# Patient Record
Sex: Male | Born: 1965 | Race: White | Hispanic: No | Marital: Single | Smoking: Former smoker
Health system: Southern US, Community
[De-identification: ages and names within clinical notes are randomized; demographics above are authoritative.]

## PROBLEM LIST (undated history)

## (undated) DIAGNOSIS — E66811 Obesity, class 1: Secondary | ICD-10-CM

## (undated) DIAGNOSIS — H43393 Other vitreous opacities, bilateral: Secondary | ICD-10-CM

## (undated) DIAGNOSIS — I517 Cardiomegaly: Secondary | ICD-10-CM

## (undated) DIAGNOSIS — F419 Anxiety disorder, unspecified: Secondary | ICD-10-CM

## (undated) DIAGNOSIS — U071 COVID-19: Secondary | ICD-10-CM

## (undated) DIAGNOSIS — B078 Other viral warts: Secondary | ICD-10-CM

## (undated) DIAGNOSIS — L719 Rosacea, unspecified: Secondary | ICD-10-CM

## (undated) DIAGNOSIS — J449 Chronic obstructive pulmonary disease, unspecified: Secondary | ICD-10-CM

## (undated) DIAGNOSIS — H5203 Hypermetropia, bilateral: Secondary | ICD-10-CM

## (undated) DIAGNOSIS — I493 Ventricular premature depolarization: Secondary | ICD-10-CM

## (undated) DIAGNOSIS — H16203 Unspecified keratoconjunctivitis, bilateral: Secondary | ICD-10-CM

## (undated) DIAGNOSIS — Z86711 Personal history of pulmonary embolism: Secondary | ICD-10-CM

## (undated) DIAGNOSIS — Z7901 Long term (current) use of anticoagulants: Secondary | ICD-10-CM

## (undated) DIAGNOSIS — M542 Cervicalgia: Secondary | ICD-10-CM

## (undated) DIAGNOSIS — N529 Male erectile dysfunction, unspecified: Secondary | ICD-10-CM

## (undated) DIAGNOSIS — R5382 Chronic fatigue, unspecified: Secondary | ICD-10-CM

## (undated) DIAGNOSIS — E785 Hyperlipidemia, unspecified: Secondary | ICD-10-CM

## (undated) DIAGNOSIS — R002 Palpitations: Secondary | ICD-10-CM

## (undated) DIAGNOSIS — R072 Precordial pain: Secondary | ICD-10-CM

## (undated) DIAGNOSIS — E6609 Other obesity due to excess calories: Secondary | ICD-10-CM

## (undated) DIAGNOSIS — G4762 Sleep related leg cramps: Secondary | ICD-10-CM

## (undated) HISTORY — DX: Other viral warts: B07.8

## (undated) HISTORY — PX: ESOPHAGOGASTRODUODENOSCOPY: SHX1529

## (undated) HISTORY — DX: Ventricular premature depolarization: I49.3

## (undated) HISTORY — DX: Cervicalgia: M54.2

## (undated) HISTORY — DX: Hypermetropia, bilateral: H52.03

## (undated) HISTORY — DX: Anxiety disorder, unspecified: F41.9

## (undated) HISTORY — DX: Chronic obstructive pulmonary disease, unspecified: J44.9

## (undated) HISTORY — DX: Palpitations: R00.2

## (undated) HISTORY — DX: Obesity, class 1: E66.811

## (undated) HISTORY — DX: Other vitreous opacities, bilateral: H43.393

## (undated) HISTORY — DX: Cardiomegaly: I51.7

## (undated) HISTORY — DX: Long term (current) use of anticoagulants: Z79.01

## (undated) HISTORY — DX: Other obesity due to excess calories: E66.09

## (undated) HISTORY — DX: Precordial pain: R07.2

## (undated) HISTORY — DX: Hyperlipidemia, unspecified: E78.5

## (undated) HISTORY — DX: Rosacea, unspecified: L71.9

## (undated) HISTORY — DX: Chronic fatigue, unspecified: R53.82

## (undated) HISTORY — PX: TONSILLECTOMY: SUR1361

## (undated) HISTORY — DX: Male erectile dysfunction, unspecified: N52.9

## (undated) HISTORY — DX: Unspecified keratoconjunctivitis, bilateral: H16.203

## (undated) HISTORY — DX: Personal history of pulmonary embolism: Z86.711

## (undated) HISTORY — PX: COLONOSCOPY: SHX174

## (undated) HISTORY — DX: Sleep related leg cramps: G47.62

---

## 2017-07-21 DIAGNOSIS — E782 Mixed hyperlipidemia: Secondary | ICD-10-CM | POA: Insufficient documentation

## 2017-07-21 HISTORY — DX: Mixed hyperlipidemia: E78.2

## 2017-08-05 DIAGNOSIS — E6609 Other obesity due to excess calories: Secondary | ICD-10-CM | POA: Insufficient documentation

## 2017-08-15 DIAGNOSIS — R079 Chest pain, unspecified: Secondary | ICD-10-CM

## 2017-08-15 HISTORY — DX: Chest pain, unspecified: R07.9

## 2017-08-16 DIAGNOSIS — R002 Palpitations: Secondary | ICD-10-CM | POA: Insufficient documentation

## 2017-11-19 DIAGNOSIS — I2699 Other pulmonary embolism without acute cor pulmonale: Secondary | ICD-10-CM

## 2017-11-19 HISTORY — DX: Other pulmonary embolism without acute cor pulmonale: I26.99

## 2018-02-24 DIAGNOSIS — I493 Ventricular premature depolarization: Secondary | ICD-10-CM | POA: Insufficient documentation

## 2018-02-24 DIAGNOSIS — I517 Cardiomegaly: Secondary | ICD-10-CM | POA: Insufficient documentation

## 2018-04-13 DIAGNOSIS — Z7901 Long term (current) use of anticoagulants: Secondary | ICD-10-CM | POA: Insufficient documentation

## 2018-04-13 HISTORY — DX: Long term (current) use of anticoagulants: Z79.01

## 2018-04-29 DIAGNOSIS — F418 Other specified anxiety disorders: Secondary | ICD-10-CM | POA: Insufficient documentation

## 2018-04-29 DIAGNOSIS — J449 Chronic obstructive pulmonary disease, unspecified: Secondary | ICD-10-CM | POA: Insufficient documentation

## 2018-04-29 HISTORY — DX: Chronic obstructive pulmonary disease, unspecified: J44.9

## 2018-12-10 HISTORY — PX: CARDIAC CATHETERIZATION: SHX172

## 2019-04-12 ENCOUNTER — Telehealth: Payer: Self-pay | Admitting: Cardiology

## 2019-04-12 NOTE — Telephone Encounter (Signed)
Virtual Visit Pre-Appointment Phone Call  "(Name), I am calling you today to discuss your upcoming appointment. We are currently trying to limit exposure to the virus that causes COVID-19 by seeing patients at home rather than in the office."  1. "What is the BEST phone number to call the day of the visit?" - include this in appointment notes  2. Do you have or have access to (through a family member/friend) a smartphone with video capability that we can use for your visit?" a. If yes - list this number in appt notes as cell (if different from BEST phone #) and list the appointment type as a VIDEO visit in appointment notes b. If no - list the appointment type as a PHONE visit in appointment notes  3. Confirm consent - "In the setting of the current Covid19 crisis, you are scheduled for a (phone or video) visit with your provider on (date) at (time).  Just as we do with many in-office visits, in order for you to participate in this visit, we must obtain consent.  If you'd like, I can send this to your mychart (if signed up) or email for you to review.  Otherwise, I can obtain your verbal consent now.  All virtual visits are billed to your insurance company just like a normal visit would be.  By agreeing to a virtual visit, we'd like you to understand that the technology does not allow for your provider to perform an examination, and thus may limit your provider's ability to fully assess your condition. If your provider identifies any concerns that need to be evaluated in person, we will make arrangements to do so.  Finally, though the technology is pretty good, we cannot assure that it will always work on either your or our end, and in the setting of a video visit, we may have to convert it to a phone-only visit.  In either situation, we cannot ensure that we have a secure connection.  Are you willing to proceed?" STAFF: Did the patient verbally acknowledge consent to telehealth visit? Document  YES/NO here: Yes per pt  4. Advise patient to be prepared - "Two hours prior to your appointment, go ahead and check your blood pressure, pulse, oxygen saturation, and your weight (if you have the equipment to check those) and write them all down. When your visit starts, your provider will ask you for this information. If you have an Apple Watch or Kardia device, please plan to have heart rate information ready on the day of your appointment. Please have a pen and paper handy nearby the day of the visit as well."  5. Give patient instructions for MyChart download to smartphone OR Doximity/Doxy.me as below if video visit (depending on what platform provider is using)  6. Inform patient they will receive a phone call 15 minutes prior to their appointment time (may be from unknown caller ID) so they should be prepared to answer    TELEPHONE CALL NOTE  Patrick BameDavid Gibbs has been deemed a candidate for a follow-up tele-health visit to limit community exposure during the Covid-19 pandemic. I spoke with the patient via phone to ensure availability of phone/video source, confirm preferred email & phone number, and discuss instructions and expectations.  I reminded Patrick BameDavid Gibbs to be prepared with any vital sign and/or heart rhythm information that could potentially be obtained via home monitoring, at the time of his visit. I reminded Patrick BameDavid Mccartin to expect a phone call prior to his  visit.  Patrick Gibbs 04/12/2019 12:20 PM   INSTRUCTIONS FOR DOWNLOADING THE MYCHART APP TO SMARTPHONE  - The patient must first make sure to have activated MyChart and know their login information - If Apple, go to [employer] and type in MyChart in the search bar and download the app. If Android, ask patient to go to [employer] and type in [place] in the search bar and download the app. The app is free but as with any other app downloads, their phone may require them to verify saved payment information or Apple/Android  password.  - The patient will need to then log into the app with their MyChart username and password, and select [facility] as their healthcare provider to link the account. When it is time for your visit, go to the MyChart app, find appointments, and click Begin Video Visit. Be sure to Select Allow for your device to access the Microphone and Camera for your visit. You will then be connected, and your provider will be with you shortly.  **If they have any issues connecting, or need assistance please contact MyChart service desk [phone])**  **If using a computer, in order to ensure the best quality for their visit they will need to use either of the following Internet Browsers: [employer] or Google Chrome**  IF USING DOXIMITY or DOXY.ME - The patient will receive a link just prior to their visit by text.     FULL LENGTH CONSENT FOR TELE-HEALTH VISIT   I hereby voluntarily request, consent and authorize [facility] and its employed or contracted physicians, physician assistants, nurse practitioners or other licensed health care professionals (the Practitioner), to provide me with telemedicine health care services (the Services") as deemed necessary by the treating Practitioner. I acknowledge and consent to receive the Services by the Practitioner via telemedicine. I understand that the telemedicine visit will involve communicating with the Practitioner through live audiovisual communication technology and the disclosure of certain medical information by electronic transmission. I acknowledge that I have been given the opportunity to request an in-person assessment or other available alternative prior to the telemedicine visit and am voluntarily participating in the telemedicine visit.  I understand that I have the right to withhold or withdraw my consent to the use of telemedicine in the course of my care at any time, without affecting my right to future care or treatment,  and that the Practitioner or I may terminate the telemedicine visit at any time. I understand that I have the right to inspect all information obtained and/or recorded in the course of the telemedicine visit and may receive copies of available information for a reasonable fee.  I understand that some of the potential risks of receiving the Services via telemedicine include:   Delay or interruption in medical evaluation due to technological equipment failure or disruption;  Information transmitted may not be sufficient (e.g. poor resolution of images) to allow for appropriate medical decision making by the Practitioner; and/or   In rare instances, security protocols could fail, causing a breach of personal health information.  Furthermore, I acknowledge that it is my responsibility to provide information about my medical history, conditions and care that is complete and accurate to the best of my ability. I acknowledge that Practitioner's advice, recommendations, and/or decision may be based on factors not within their control, such as incomplete or inaccurate data provided by me or distortions of diagnostic images or specimens that may result from electronic transmissions. I understand  that the practice of medicine is not an exact science and that Practitioner makes no warranties or guarantees regarding treatment outcomes. I acknowledge that I will receive a copy of this consent concurrently upon execution via email to the email address I last provided but may also request a printed copy by calling the office of [facility].    I understand that my insurance will be billed for this visit.   I have read or had this consent read to me.  I understand the contents of this consent, which adequately explains the benefits and risks of the Services being provided via telemedicine.   I have been provided ample opportunity to ask questions regarding this consent and the Services and have had my questions  answered to my satisfaction.  I give my informed consent for the services to be provided through the use of telemedicine in my medical care  By participating in this telemedicine visit I agree to the above.

## 2019-04-13 ENCOUNTER — Telehealth: Payer: Self-pay | Admitting: Cardiology

## 2019-04-13 NOTE — Progress Notes (Signed)
Cardiology Office Note:    Date:  04/14/2019   ID:  Patrick Gibbs, DOB Feb 02, 1966, MRN [id number]  PCP:  Patrick Brazil, DO  Cardiologist:  Patrick Herrlich, MD   Referring MD: Patrick Brazil, DO  ASSESSMENT:    1. Chest pain in adult   2. Mixed hyperlipidemia   3. Bilateral pulmonary embolism (HCC)   4. Long term (current) use of anticoagulants   5. Anxiety about health   6. PVC's (premature ventricular contractions)   7. Stage 1 mild COPD by GOLD classification (HCC)    PLAN:    In order of problems listed above:  1. Chest pain very atypical nonanginal it appears to be radicular in nature I suspect cervical spine disc disease he will go forward and have EMG nerve conduction studies I advised CT scan of his cervical spine he declines at this time awaiting evaluation by surgery 2. History of pulmonary embolism evaluated at [facility] hematology remains on lifelong anticoagulation 3. Stable continue warfarin 4. PVCs presently having little or no symptoms would not initiate antiarrhythmic drug 5. COPD is mild he is not having daily symptoms. 6. Gallbladder polyp increase in size in some individuals this represents adherent gallstones he asked me and I told him from my perspective reviewing records he is optimized and a good surgical candidate for cholecystectomy  Next appointment 4 weeks   Medication Adjustments/Labs and Tests Ordered: Current medicines are reviewed at length with the patient today.  Concerns regarding medicines are outlined above.  No orders of the defined types were placed in this encounter.  No orders of the defined types were placed in this encounter.    Complaint chest pain  History of Present Illness:    Patrick Gibbs is a 53 y.o. male who is being seen today for the evaluation of chest pain at the request of the patient. PMH PE on anticoagulation, COPD, anxiety, HLD, insomnia. He had cardiac cathterization 12/15/18 at [facility] which showed no  significant CAD. He has had substantial cardiac workup detailed in Care Everywhere. Of note, cardiac cath 05/2013 and 12/2018 with no CAD and normal EF.   He has a history of superficial thrombophlebitis left lower extremity in 2015.  Subsequently in September 2018 he developed back and left chest pain pleuritic in nature with hemoptysis was evaluated in the emergency room was found to have bilateral lower lobe lower lobe pulmonary emboli without right heart strain he has initiated anticoagulation with Eliquis in the event was on provoked without a modifiable risk factor.  At the time lower extremity venous duplex showed no evidence of venous thromboembolism.  He has been seen by hematology and his hypercoagulable evaluation was normal   04/11/19 visit with PCP for paresthesia of upper and lower extremities prescribed low dose gabapentin plan for NCV  04/12/19 AM visit to ED [facility] for L sided chest pain with normal EKG, unremarkable CXR, & negative troponin x2. Per report EKG showed NSR 66.   04/12/19 PM visit to PCP for LUQ abdominal pain plan for abd u/s which showed "enlarging polyp" in his galbladder and has been referred to general surgery.  He is concerned of recurrent chest pain was seen in the emergency room this week.  His chest pain is nonanginal it is nonexertional and sharp and unrelieved with rest he complains of pain on both sides of his neck radiating between the shoulders down the left arm up to his jaw bilaterally and is having numbness  and tingling in both upper and lower extremities is scheduled for EMGs and nerve conduction studies he is taking Lyrica.  He had a plain x-ray of the cervical spine performed that showed narrowing of the foramina.  I am aware that a history of a pulmonary embolism he has been anticoagulated and has had no clinical recurrence.  He also has gallbladder polyps and is scheduled to see a surgeon and he has the impression require cholecystectomy.  He  is not having exertional chest pain dyspnea palpitation or syncope.  I told him as my perspective that he should not repeat an ischemia evaluation with normal coronary angiography performed in February he has no evidence of aortopathy chronic pericardial disease or recurrent pulmonary embolism or pulmonary artery hypertension.  His symptoms are radicular in nature I suspect that he is cervical spine disease and may have spinal stenosis.  I advised him to go ahead and have his EMGs nerve conduction studies recommended CT scan of his surgical spine he wants to delay until he has been seen by surgery and we left things that he would come back and see me in the office in 4 weeks face-to-face I spent 40 minutes reviewing his records prior to the visit including admission with pulmonary embolism 2 cardiac catheterizations to different cardiology groups in their evaluations and a multitude of diagnostic cardiac and noncardiac testing.  I reviewed the emergency room note from this week with no evidence of acute coronary syndrome.  Past Medical History:  Diagnosis Date  . Anterior neck pain   . Anticoagulated   . Anxiety   . Chronic fatigue   . Class 1 obesity due to excess calories with serious comorbidity and body mass index (BMI) of 30.0 to 30.9 in adult   . COPD (chronic obstructive pulmonary disease) (HCC)   . Erectile dysfunction   . History of pulmonary embolus (PE)   . Hyperlipidemia   . Hyperopia with presbyopia of both eyes   . Keratoconjunctivitis of both eyes   . Mild concentric left ventricular hypertrophy (LVH)   . Nocturnal leg cramps   . Palmar wart   . Palpitations   . Precordial chest pain   . PVC's (premature ventricular contractions)   . Rosacea   . Vitreous floater, bilateral     Past Surgical History:  Procedure Laterality Date  . CARDIAC CATHETERIZATION  12/2018  . TONSILLECTOMY      Current Medications: Current Meds  Medication Sig  . acetaminophen (TYLENOL) 500 MG  tablet Take 1,000 mg by mouth daily as needed.  Marland Kitchen aspirin 81 MG chewable tablet Chew 1 tablet by mouth daily. 3 times per week  . gabapentin (NEURONTIN) 100 MG capsule Take 1 capsule by mouth daily.  Marland Kitchen LORazepam (ATIVAN) 1 MG tablet Take 0.5 mg by mouth 2 (two) times daily.  . Tiotropium Bromide Monohydrate (SPIRIVA RESPIMAT) 2.5 MCG/ACT AERS Inhale 2 puffs into the lungs daily.  Marland Kitchen warfarin (COUMADIN) 5 MG tablet TAKE 2 TABLETS BY MOUTH DAILY AT 6 PM, EXCEPT 2 AND 1/2 TABLETS ON MONDAY, WEDNESDAY, FRIDAY AS DIRECTED     Allergies:   Ciprofloxacin; Escitalopram; and Cymbalta [duloxetine hcl]   Social History   Socioeconomic History  . Marital status: Single    Spouse name: Not on file  . Number of children: Not on file  . Years of education: Not on file  . Highest education level: Not on file  Occupational History  . Not on file  Social Needs  .  Financial resource strain: Not on file  . Food insecurity:    Worry: Not on file    Inability: Not on file  . Transportation needs:    Medical: Not on file    Non-medical: Not on file  Tobacco Use  . Smoking status: Never Smoker  . Smokeless tobacco: Never Used  Substance and Sexual Activity  . Alcohol use: Not Currently  . Drug use: Never  . Sexual activity: Not on file  Lifestyle  . Physical activity:    Days per week: Not on file    Minutes per session: Not on file  . Stress: Not on file  Relationships  . Social connections:    Talks on phone: Not on file    Gets together: Not on file    Attends religious service: Not on file    Active member of club or organization: Not on file    Attends meetings of clubs or organizations: Not on file    Relationship status: Not on file  Other Topics Concern  . Not on file  Social History Narrative  . Not on file     Family History: The patient's family history includes CAD in his mother; Cataracts in his father, maternal grandmother, mother, paternal grandfather, and paternal  grandmother; Diabetes in his father; Heart attack in his maternal uncle and maternal uncle.  ROS:   Review of Systems  Constitution: Negative.  HENT: Negative.   Eyes: Negative.   Cardiovascular: Positive for chest pain.  Respiratory: Negative.   Endocrine: Negative.   Hematologic/Lymphatic: Negative.   Skin: Negative.   Musculoskeletal: Positive for back pain and neck pain.  Gastrointestinal: Negative.   Genitourinary: Negative.   Neurological: Positive for numbness and paresthesias.  Psychiatric/Behavioral: The patient is nervous/anxious.   Allergic/Immunologic: Negative.    Please see the history of present illness.     All other systems reviewed and are negative.  EKGs/Labs/Other Studies Reviewed:     CLINICAL DATA: BILATERAL neck pain with pain and pressure down LEFT arm to elbow area for 1 year, upper back pain for 1 year worse in last 2 weeks EXAM: CERVICAL SPINE - COMPLETE 4+ VIEW COMPARISON: None FINDINGS: Prevertebral soft tissues normal thickness. Osseous mineralization normal for technique. Vertebral body and disc space heights maintained. No acute fracture, subluxation or bone destruction. Slight encroachment upon BILATERAL C3-C4 neural foramina, on RIGHT by uncovertebral spurs and on LEFT by more facet hypertrophy. C1-C2 alignment normal. IMPRESSION: Slight encroachment upon the cervical neural foramina bilaterally at C3-C4. Electronically Signed  By: Ulyses SouthwardMark Boles M.D.  On: 06/16/2018 09:36  The following studies were reviewed today: Cardiac cath 12/15/2018 at [facility]:   LM: medium caliber vessel but appears free from any significant disease  LAD: medium caliber vessel which gives rise to mulitple diagonal branches but appears to be free from any significant disease  LCx: medium caliber vessel which gives rise to 2 obtuse marginal branches. It appears to be normal.   RCA: medium size and dominant artery. No significant lesion noted.    No  significant CAD noted. Normal LVEDP and aortogram.   12/20/18 CT Angio Chest: Negative PE. Borderline mild cardiomegaly.   05/2018 Event Monitor: Normal sinus rhythm. Very rare PVCs. Rare PACs. One episode 6 beats EAT lasting 2 seconds. No atrial fib. No pauses or bradycardia.   03/09/18 Carotid Duplex: Mild bilateral atherosclerosis, mild ICA stenosis 1-39% bilaterally.   Echo 02/2018: EF 55-60%, mild LVH, trace MR  EKG:  [id number].[facility] Result Narrative  Ventricular Rate          66    BPM          Atrial Rate            66    BPM          P-R Interval            174    ms          QRS Duration            84    ms          Q-T Interval            396    ms          QTC                415    ms          P Axis               65    degrees        R Axis               -14    degrees        T Axis               38    degrees         Sinus rhythm Normal ECG When compared with ECG of 29-Jun-2018 16:23, Premature ventricular complexes are no longer present Confirmed by Ginger Carne [phone]) on 04/12/2019 8:00:28 AM    Recent Labs: 12/09/2018 BMP was normal GFR 93 cc/min creatinine 0.93 potassium 4.4.  And CBC normal 12/15/2018 hemoglobin A1c 5.5 04/12/2019 CMP is normal GFR greater than 90 cc TSH normal 1.21 Most recent INR 04/12/1999 23.03 and CBC is normal  Physical Exam:    VS:  BP 120/80   Ht  (1.753 m)   Wt 209 lb (94.8 kg)   BMI 30.86 kg/m     Wt Readings from Last 3 Encounters:  04/14/19 209 lb (94.8 kg)     He did a phone only visit at his request he was at work his mood affect thought cognition were normal no respiratory distress    Signed, Patrick Herrlich, MD  04/14/2019 2:18 PM    [facility]

## 2019-04-14 ENCOUNTER — Other Ambulatory Visit: Payer: Self-pay

## 2019-04-14 ENCOUNTER — Encounter: Payer: Self-pay | Admitting: Cardiology

## 2019-04-14 ENCOUNTER — Telehealth (INDEPENDENT_AMBULATORY_CARE_PROVIDER_SITE_OTHER): Payer: Self-pay | Admitting: Cardiology

## 2019-04-14 ENCOUNTER — Encounter: Payer: Self-pay | Admitting: *Deleted

## 2019-04-14 VITALS — BP 120/80 | Ht 69.0 in | Wt 209.0 lb

## 2019-04-14 DIAGNOSIS — I493 Ventricular premature depolarization: Secondary | ICD-10-CM

## 2019-04-14 DIAGNOSIS — J449 Chronic obstructive pulmonary disease, unspecified: Secondary | ICD-10-CM

## 2019-04-14 DIAGNOSIS — Z7901 Long term (current) use of anticoagulants: Secondary | ICD-10-CM

## 2019-04-14 DIAGNOSIS — F418 Other specified anxiety disorders: Secondary | ICD-10-CM

## 2019-04-14 DIAGNOSIS — I2699 Other pulmonary embolism without acute cor pulmonale: Secondary | ICD-10-CM

## 2019-04-14 DIAGNOSIS — R079 Chest pain, unspecified: Secondary | ICD-10-CM

## 2019-04-14 NOTE — Patient Instructions (Signed)
Medication Instructions:  Your physician recommends that you continue on your current medications as directed. Please refer to the Current Medication list given to you today.  If you need a refill on your cardiac medications before your next appointment, please call your pharmacy.   Lab work: None If you have labs (blood work) drawn today and your tests are completely normal, you will receive your results only by: . MyChart Message (if you have MyChart) OR . A paper copy in the mail If you have any lab test that is abnormal or we need to change your treatment, we will call you to review the results.  Testing/Procedures None  Follow-Up: At [facility], you and your health needs are our priority.  As part of our continuing mission to provide you with exceptional heart care, we have created designated Provider Care Teams.  These Care Teams include your primary Cardiologist (physician) and Advanced Practice Providers (APPs -  Physician Assistants and Nurse Practitioners) who all work together to provide you with the care you need, when you need it. You will need a follow up appointment in 4 weeks.   Any Other Special Instructions Will Be Listed Below (If Applicable).    

## 2019-04-24 DIAGNOSIS — R202 Paresthesia of skin: Secondary | ICD-10-CM | POA: Insufficient documentation

## 2019-04-24 DIAGNOSIS — M79601 Pain in right arm: Secondary | ICD-10-CM | POA: Insufficient documentation

## 2019-04-24 DIAGNOSIS — M792 Neuralgia and neuritis, unspecified: Secondary | ICD-10-CM | POA: Insufficient documentation

## 2019-04-24 DIAGNOSIS — M79602 Pain in left arm: Secondary | ICD-10-CM

## 2019-04-24 HISTORY — DX: Pain in right arm: M79.601

## 2019-04-24 HISTORY — DX: Neuralgia and neuritis, unspecified: M79.2

## 2019-04-24 HISTORY — DX: Pain in left arm: M79.602

## 2019-04-24 HISTORY — DX: Paresthesia of skin: R20.2

## 2019-04-25 DIAGNOSIS — R1010 Upper abdominal pain, unspecified: Secondary | ICD-10-CM | POA: Insufficient documentation

## 2019-04-25 HISTORY — DX: Upper abdominal pain, unspecified: R10.10

## 2019-05-31 ENCOUNTER — Ambulatory Visit: Payer: Self-pay | Admitting: Cardiology

## 2019-06-18 NOTE — Progress Notes (Signed)
Cardiology Office Note:    Date:  06/19/2019   ID:  Patrick Gibbs, DOB 08-13-1966, MRN [id number]  PCP:  Patrick Girt, DO  Cardiologist:  Patrick More, MD    Referring MD: Patrick Girt, DO    ASSESSMENT:    1. Chest pain in adult   2. Long term (current) use of anticoagulants   3. PVC's (premature ventricular contractions)    PLAN:    In order of problems listed above:  1. Chest pain in adult - Very atypical nonanginal. Occurs after lying down, L chest, relieved by activity. Cardiac cath 12/2018 without significant CAD. Would not pursue additional cardiac testing. Recommended he proceed with EGD as recommended by GI.  2. Long term use of anticoagulant - Secondary to DVT and subseuqent PE. Denies bleeding complications. Follows with [facility]. 3. PVC - Denies palpitations, irregular heart beats. EKG today SR without PVC. Would not initiate antiarrthymic drug.  4. Anxiety about health - Primary problem addressed in today's office visit. Reaffirmed that he does not have CAD based on cath 12/2018 and atypical anginal symptoms. Reaffirmed that he does not have heart failure based on no edema, no SOB with activity and echocardiogram 02/2018 with EF 55-60%, normal LV filling pattern. Reassured that repeat echocardiogram nor ischemic evaluation were indicated at this time. Encouraged to follow up with GI regarding EGD and workup regarding galbladder polyp/abdominal pain. Encouraged to follow up with his PCP/ortho regarding back and neck pain.    Next appointment: He will follow on an as-needed basis.    Medication Adjustments/Labs and Tests Ordered: Current medicines are reviewed at length with the patient today.  Concerns regarding medicines are outlined above.  Orders Placed This Encounter  Procedures  . EKG 12-Lead   No orders of the defined types were placed in this encounter.   Chief Complaint  Patient presents with  . Follow-up  . Chest Pain    History  of Present Illness:    Patrick Gibbs is a 53 y.o. male with a hx of chest pain last seen 04/14/2019. He has a PMH of PE on anticoagulation, COPD, anxiety, HLD, insomnia.   History of superficial thrombophlebitis LLE 2015. Subsequently 2018 developed back and L chest pain, pleuritic in nature with hemoptysis - in ED found to have bilateral LL PE without right heart strain. Initiated anticoagulation with Eliquis. At that time LE venous duplex negative bilaterally. Has been seen by hematology and his hypercoagulable evaluation was norma.   Previous chest pain workup includes cardiac cath 12/15/18 at [facility] with no significant CAD. Cardiac cath 05/2013 with no CAD and normal EF. He has had substantial cardiac workup detailed in Care Everywhere.   Recent healthcare visits include 04/11/19 visit with PCP for paresthesia of upper and lower extremities, Rx gabapentin, plan for NCV. 04/12/19 ED [facility] L chest pain with normal EKG (NSR 66 per report), unremarkable CXR, negative troponin x2.  04/12/19 visit to PCP for LUQ abdominal pain, abd u/s "enlarging polyp in galbladder" referred to surgery. Tells me he had a HIDA scan which was negative and there are not presently plans to remove his galbladder. Tells me he has endoscopy with GI upcoming.    He has multiple complaints today. Is very concerned about the "pain in my heart" - this is L sided chest pain that occurs at rest, worse when laying down, better with movement, does not radiate, is not associated with SOB nor diaphoresis. Educated that chest pain is  not always cardiac in origin. He has had multiple encouraging cardiac studies including normal cath 12/2018, normal EKG today, negative troponin on visit to ED a couple months ago, and echo 02/2018 with EF 55-60%, mild LVH, normal LV filling pattern, normal RV, no significant valvular disease.   Additional complaints include "shooting" leg pain, neck pain, upper back pain, fatigue, dizziness  associated with headaches. His dizziness is not associated with positional changes and unlikely orthostatic hypotension.   Tells me he has shortness of breath when lying down - when asked to describe he says he wakes up and can tell "I haven't been breathing right". Tells me he has been evaluated for OSA and his sleep study was normal per his report.   He has plans to undergo endoscopy with GI which he is in the process of rescheduling due to COVID19. Encouraged him to follow up with this closely.   Compliance with diet, lifestyle and medications: Yes Past Medical History:  Diagnosis Date  . Anterior neck pain   . Anticoagulated   . Anxiety   . Chronic fatigue   . Class 1 obesity due to excess calories with serious comorbidity and body mass index (BMI) of 30.0 to 30.9 in adult   . COPD (chronic obstructive pulmonary disease) (HCC)   . Erectile dysfunction   . History of pulmonary embolus (PE)   . Hyperlipidemia   . Hyperopia with presbyopia of both eyes   . Keratoconjunctivitis of both eyes   . Mild concentric left ventricular hypertrophy (LVH)   . Nocturnal leg cramps   . Palmar wart   . Palpitations   . Precordial chest pain   . PVC's (premature ventricular contractions)   . Rosacea   . Vitreous floater, bilateral     Past Surgical History:  Procedure Laterality Date  . CARDIAC CATHETERIZATION  12/2018  . TONSILLECTOMY      Current Medications: Current Meds  Medication Sig  . acetaminophen (TYLENOL) 500 MG tablet Take 1,000 mg by mouth daily as needed.  Marland Kitchen. aspirin 81 MG chewable tablet Chew 1 tablet by mouth daily. 3 times per week  . LORazepam (ATIVAN) 1 MG tablet Take 0.5 mg by mouth 2 (two) times daily.  Marland Kitchen. warfarin (COUMADIN) 5 MG tablet TAKE 2 TABLETS BY MOUTH DAILY AT 6 PM, EXCEPT 2 AND 1/2 TABLETS ON MONDAY, WEDNESDAY, FRIDAY AS DIRECTED     Allergies:   Ciprofloxacin, Escitalopram, and Cymbalta [duloxetine hcl]   Social History   Socioeconomic History  .  Marital status: Single    Spouse name: Not on file  . Number of children: Not on file  . Years of education: Not on file  . Highest education level: Not on file  Occupational History  . Not on file  Social Needs  . Financial resource strain: Not on file  . Food insecurity    Worry: Not on file    Inability: Not on file  . Transportation needs    Medical: Not on file    Non-medical: Not on file  Tobacco Use  . Smoking status: Former Smoker    Packs/day: 1.00    Years: 0.50    Pack years: 0.50    Types: Cigarettes    Quit date: 2008    Years since quitting: 12.6  . Smokeless tobacco: Never Used  Substance and Sexual Activity  . Alcohol use: Yes    Comment: rarely  . Drug use: Never  . Sexual activity: Not on file  Lifestyle  .  Physical activity    Days per week: Not on file    Minutes per session: Not on file  . Stress: Not on file  Relationships  . Social Musicianconnections    Talks on phone: Not on file    Gets together: Not on file    Attends religious service: Not on file    Active member of club or organization: Not on file    Attends meetings of clubs or organizations: Not on file    Relationship status: Not on file  Other Topics Concern  . Not on file  Social History Narrative  . Not on file     Family History: The patient's family history includes CAD in his mother; Cataracts in his father, maternal grandmother, mother, paternal grandfather, and paternal grandmother; Diabetes in his father; Heart attack in his maternal uncle and maternal uncle. ROS:   Please see the history of present illness.    Review of Systems  Constitution: Positive for malaise/fatigue. Negative for chills and fever.  Cardiovascular: Positive for chest pain. Negative for dyspnea on exertion, irregular heartbeat, leg swelling, near-syncope, orthopnea, palpitations, paroxysmal nocturnal dyspnea and syncope.  Respiratory: Positive for sleep disturbances due to breathing. Negative for cough,  shortness of breath and wheezing.   Gastrointestinal: Positive for abdominal pain. Negative for nausea and vomiting.  Neurological: Positive for dizziness. Negative for light-headedness and weakness.    All other systems reviewed and are negative.  EKGs/Labs/Other Studies Reviewed:    The following studies were reviewed today:  EKG:  EKG ordered today and personally reviewed.  The ekg ordered today demonstrates SR with no acute ST/T wave changes.   HIDA 05/10/2019: NUCLEAR MEDICINE HEPATOBILIARY IMAGING WITH GALLBLADDER EF FINDINGS: Liver uptake of radiotracer is unremarkable. There is prompt visualization of gallbladder and small bowel, indicating patency of the cystic and common bile ducts. The patient received a weight based dose, 1.9 mcg, of sincalide intravenously with calculation of the computer generated ejection fraction of radiotracer from the gallbladder. Patient did not experience clinical symptoms with the sincalide administration. The computer generated ejection fraction of radiotracer from the gallbladder is normal at 93%, normal greater than 43%. IMPRESSION: Study within normal limits.  Recent Labs: No results found for requested labs within last 8760 hours.  Recent Lipid Panel No results found for: CHOL, TRIG, HDL, CHOLHDL, VLDL, LDLCALC, LDLDIRECT  Physical Exam:    VS:  BP 128/86 (BP Location: Right Arm, Patient Position: Sitting, Cuff Size: Large)   Pulse 63   Ht 5\' 9"  (1.753 m)   Wt 214 lb (97.1 kg)   SpO2 98%   BMI 31.60 kg/m     Wt Readings from Last 3 Encounters:  06/19/19 214 lb (97.1 kg)  04/14/19 209 lb (94.8 kg)     GEN:  Well nourished, well developed in no acute distress HEENT: Normal NECK: No JVD; No carotid bruits LYMPHATICS: No lymphadenopathy CARDIAC: RRR, no murmurs, rubs, gallops RESPIRATORY:  Clear to auscultation without rales, wheezing or rhonchi  ABDOMEN: Soft, non-tender, non-distended MUSCULOSKELETAL:  No edema; No  deformity  SKIN: Warm and dry NEUROLOGIC:  Alert and oriented x 3 PSYCHIATRIC:  Normal affect    Signed, Norman HerrlichBrian Leann Mayweather, MD  06/19/2019 12:35 PM    [facility]

## 2019-06-19 ENCOUNTER — Ambulatory Visit (INDEPENDENT_AMBULATORY_CARE_PROVIDER_SITE_OTHER): Payer: Managed Care, Other (non HMO) | Admitting: Cardiology

## 2019-06-19 ENCOUNTER — Telehealth: Payer: Self-pay | Admitting: Cardiology

## 2019-06-19 ENCOUNTER — Other Ambulatory Visit: Payer: Self-pay

## 2019-06-19 ENCOUNTER — Encounter: Payer: Self-pay | Admitting: Cardiology

## 2019-06-19 ENCOUNTER — Encounter: Payer: Self-pay | Admitting: *Deleted

## 2019-06-19 VITALS — BP 128/86 | HR 63 | Ht 69.0 in | Wt 214.0 lb

## 2019-06-19 DIAGNOSIS — I493 Ventricular premature depolarization: Secondary | ICD-10-CM | POA: Diagnosis not present

## 2019-06-19 DIAGNOSIS — R079 Chest pain, unspecified: Secondary | ICD-10-CM

## 2019-06-19 DIAGNOSIS — Z7901 Long term (current) use of anticoagulants: Secondary | ICD-10-CM

## 2019-06-19 DIAGNOSIS — F418 Other specified anxiety disorders: Secondary | ICD-10-CM | POA: Diagnosis not present

## 2019-06-19 NOTE — Telephone Encounter (Signed)
Work excuse letter has been generated. Will call patient to let him know it is ready for pick up once Dr. Bettina Gavia signs it.

## 2019-06-19 NOTE — Telephone Encounter (Signed)
Patient was seen today in office and will need a note for his boss stating that he was here today.

## 2019-06-19 NOTE — Patient Instructions (Signed)
Medication Instructions:  No changes today.   If you need a refill on your cardiac medications before your next appointment, please call your pharmacy.   Lab work: No lab work today.   If you have labs (blood work) drawn today and your tests are completely normal, you will receive your results only by: Marland Kitchen MyChart Message (if you have MyChart) OR . A paper copy in the mail If you have any lab test that is abnormal or we need to change your treatment, we will call you to review the results.  Testing/Procedures: None ordered today.   Echocardiogram was normal in 2018.   Follow-Up: At [facility], you and your health needs are our priority.  As part of our continuing mission to provide you with exceptional heart care, we have created designated Provider Care Teams.  These Care Teams include your primary Cardiologist (physician) and Advanced Practice Providers (APPs -  Physician Assistants and Nurse Practitioners) who all work together to provide you with the care you need, when you need it. You may follow up on as as-needed basis with Dr. Bettina Gavia or another member of our [place] Provider Team in [place]: Jenne Campus, MD . Jyl Heinz, MD  Any Other Special Instructions Will Be Listed Below (If Applicable).  You have had a normal cardiac cath this year. No evidence of coronary artery disease.  Your last echocardiogram in 2018 showed normal pumping function, normal left ventricle. No evidence of heart failure.   Recommend you follow up with GI regarding your endoscopy.

## 2019-06-20 NOTE — Telephone Encounter (Signed)
Patient informed that his work letter is ready for pick up. Patient requested that it be mailed to him. Will send in the mail. No further questions.

## 2019-08-08 ENCOUNTER — Telehealth: Payer: Self-pay | Admitting: Cardiology

## 2019-08-08 NOTE — Telephone Encounter (Signed)
Patient denies any chest pain at this time.

## 2019-08-08 NOTE — Telephone Encounter (Signed)
Called and spoke with patient regarding symptoms. Explained to him that per Dr. Joya Gaskins note from his last office visit on 06/19/2019, Dr. Bettina Gavia advised to follow up with GI to schedule an EGD for further evaluation of symptoms. Patient states that he is scheduled to have an EGD on 08/25/2019 but was scheduled for an appointment with Dr. Bettina Gavia tomorrow, 08/09/2019, at 1:15 pm per patient's request. Patient verbalized understanding. No further questions.

## 2019-08-08 NOTE — Progress Notes (Signed)
Cardiology Office Note:    Date:  08/09/2019   ID:  Patrick Gibbs, DOB 1966-02-11, MRN [id number]  PCP:  Nicola Girt, DO  Cardiologist:  Shirlee More, MD    Referring MD: Nicola Girt, DO    ASSESSMENT:    1. Chest pain in adult   2. Long term (current) use of anticoagulants   3. PVC's (premature ventricular contractions)    PLAN:    In order of problems listed above:  1. I reviewed his previous studies with him with a combination of chest pain normal coronary angiography myocardial perfusion study echocardiography and PVCs at risk for and will undergo cardiac MRI regarding myocarditis reach repeat echocardiogram to exclude pulmonary artery hypertension and 7-day ZIO monitor to document quantitate arrhythmia.  He will continue his current anticoagulation.  He has no PVCs on today's monitor.  I will see back in the office in 6 weeks in follow-up     Medication Adjustments/Labs and Tests Ordered: Current medicines are reviewed at length with the patient today.  Concerns regarding medicines are outlined above.  No orders of the defined types were placed in this encounter.  No orders of the defined types were placed in this encounter.   Chief Complaint  Patient presents with  . Follow-up    at the patients request    History of Present Illness:    Patrick Gibbs is a 53 y.o. male with a hx of PE on anticoagulation, COPD, anxiety, HLD, insomnia last seen 06/19/2019. History of superficial thrombophlebitis LLE 2015. Subsequently 2018 developed back and L chest pain, pleuritic in nature with hemoptysis - in ED found to have bilateral LL PE without right heart strain. Initiated anticoagulation with Eliquis. At that time LE venous duplex negative bilaterally. Has been seen by hematology and his hypercoagulable evaluation was normal.  Previous chest pain workup includes cardiac cath 12/15/18 at [facility] with no significant CAD. Cardiac cath 05/2013 with no CAD and normal EF. He  has had substantial cardiac workup detailed in Care Everywhere.  Echo 07/14/17; Summary Normal left ventricular size and systolic function with no appreciable segmental abnormality. Ejection fraction is visually estimated at 55-60% Normal right ventricle structure and function. No significant valvular abnormalities MPI 10/15/2017:  IMPRESSION: 1. No reversible ischemia or infarction. 2. Normal left ventricular wall motion. 3. Left ventricular ejection fraction 69% 4.Non invasive risk stratification*: Low  CTA chest 12/20/2018: IMPRESSION: 1.  Negative CTA for pulmonary embolism. No alternative acute pathology in the chest identified. 2. Borderline mild cardiomegaly.  Compliance with diet, lifestyle and medications: Yes  He is pending endoscopy in October.  Initially after seen by me he did well gotten exercise program recently went to walk the dogs during activity developed shortness of breath weakness precordial chest pain forced him to stop and rest almost lost consciousness and took hours to recover afterwards since then he is having episodes of chest pain with and without activity and palpitation.  He has a background history of PVCs.  He remains convinced he has underlying heart disease asked me about the value of right heart catheterization.  With his chest pain and normal cardiac studies to date and PVCs I think there is benefit in doing a cardiac MRI to exclude myocarditis it echocardiogram to evaluate for pulmonary hypertension and a repeat ambulatory heart rhythm monitor to document and quantitate arrhythmia.  Symptoms are quite atypical and I encouraged him to proceed on with endoscopy.  He remains anticoagulated with pulmonary embolism. Past Medical  History:  Diagnosis Date  . Anterior neck pain   . Anticoagulated   . Anxiety   . Chronic fatigue   . Class 1 obesity due to excess calories with serious comorbidity and body mass index (BMI) of 30.0 to 30.9 in adult   . COPD  (chronic obstructive pulmonary disease) (HCC)   . Erectile dysfunction   . History of pulmonary embolus (PE)   . Hyperlipidemia   . Hyperopia with presbyopia of both eyes   . Keratoconjunctivitis of both eyes   . Mild concentric left ventricular hypertrophy (LVH)   . Nocturnal leg cramps   . Palmar wart   . Palpitations   . Precordial chest pain   . PVC's (premature ventricular contractions)   . Rosacea   . Vitreous floater, bilateral     Past Surgical History:  Procedure Laterality Date  . CARDIAC CATHETERIZATION  12/2018  . TONSILLECTOMY      Current Medications: Current Meds  Medication Sig  . acetaminophen (TYLENOL) 500 MG tablet Take 1,000 mg by mouth daily as needed.  Marland Kitchen aspirin 81 MG chewable tablet Chew 1 tablet by mouth daily. 3 times per week  . LORazepam (ATIVAN) 1 MG tablet Take 0.5 mg by mouth 2 (two) times daily.  . Tiotropium Bromide Monohydrate (SPIRIVA RESPIMAT) 1.25 MCG/ACT AERS Inhale 1 puff into the lungs as needed.  . warfarin (COUMADIN) 5 MG tablet TAKE 2 TABLETS BY MOUTH DAILY AT 6 PM, EXCEPT 2 AND 1/2 TABLETS ON MONDAY, WEDNESDAY, FRIDAY AS DIRECTED     Allergies:   Ciprofloxacin, Escitalopram, and Cymbalta [duloxetine hcl]   Social History   Socioeconomic History  . Marital status: Single    Spouse name: Not on file  . Number of children: Not on file  . Years of education: Not on file  . Highest education level: Not on file  Occupational History  . Not on file  Social Needs  . Financial resource strain: Not on file  . Food insecurity    Worry: Not on file    Inability: Not on file  . Transportation needs    Medical: Not on file    Non-medical: Not on file  Tobacco Use  . Smoking status: Former Smoker    Packs/day: 1.00    Years: 0.50    Pack years: 0.50    Types: Cigarettes    Quit date: 2008    Years since quitting: 12.7  . Smokeless tobacco: Never Used  Substance and Sexual Activity  . Alcohol use: Yes    Comment: rarely  .  Drug use: Never  . Sexual activity: Not on file  Lifestyle  . Physical activity    Days per week: Not on file    Minutes per session: Not on file  . Stress: Not on file  Relationships  . Social Musician on phone: Not on file    Gets together: Not on file    Attends religious service: Not on file    Active member of club or organization: Not on file    Attends meetings of clubs or organizations: Not on file    Relationship status: Not on file  Other Topics Concern  . Not on file  Social History Narrative  . Not on file     Family History: The patient's family history includes CAD in his mother; Cataracts in his father, maternal grandmother, mother, paternal grandfather, and paternal grandmother; Diabetes in his father; Heart attack in his maternal uncle  and maternal uncle. ROS:   Please see the history of present illness.    All other systems reviewed and are negative.  EKGs/Labs/Other Studies Reviewed:    The following studies were reviewed today:  EKG:  EKG ordered today and personally reviewed.  The ekg ordered today demonstrates sinus rhythm normal EKG  Recent Labs: No results found for requested labs within last 8760 hours.  Recent Lipid Panel No results found for: CHOL, TRIG, HDL, CHOLHDL, VLDL, LDLCALC, LDLDIRECT  Physical Exam:    VS:  BP 130/82 (BP Location: Right Arm, Patient Position: Sitting, Cuff Size: Large)   Pulse 67   Temp (!) 97.3 F (36.3 C)   Ht 5\' 9"  (1.753 m)   Wt 215 lb (97.5 kg)   SpO2 97%   BMI 31.75 kg/m     Wt Readings from Last 3 Encounters:  08/09/19 215 lb (97.5 kg)  06/19/19 214 lb (97.1 kg)  04/14/19 209 lb (94.8 kg)     GEN:  Well nourished, well developed in no acute distress HEENT: Normal NECK: No JVD; No carotid bruits LYMPHATICS: No lymphadenopathy CARDIAC: RRR, no murmurs, rubs, gallops RESPIRATORY:  Clear to auscultation without rales, wheezing or rhonchi  ABDOMEN: Soft, non-tender,  non-distended MUSCULOSKELETAL:  No edema; No deformity  SKIN: Warm and dry NEUROLOGIC:  Alert and oriented x 3 PSYCHIATRIC:  Normal affect    Signed, Norman HerrlichBrian Jazir Newey, MD  08/09/2019 1:37 PM    [facility]

## 2019-08-08 NOTE — Telephone Encounter (Signed)
Patient still having same symptoms, chest pain

## 2019-08-09 ENCOUNTER — Other Ambulatory Visit: Payer: Self-pay

## 2019-08-09 ENCOUNTER — Encounter: Payer: Self-pay | Admitting: *Deleted

## 2019-08-09 ENCOUNTER — Ambulatory Visit (INDEPENDENT_AMBULATORY_CARE_PROVIDER_SITE_OTHER): Payer: Managed Care, Other (non HMO) | Admitting: Cardiology

## 2019-08-09 VITALS — BP 130/82 | HR 67 | Temp 97.3°F | Ht 69.0 in | Wt 215.0 lb

## 2019-08-09 DIAGNOSIS — Z7901 Long term (current) use of anticoagulants: Secondary | ICD-10-CM

## 2019-08-09 DIAGNOSIS — R0602 Shortness of breath: Secondary | ICD-10-CM

## 2019-08-09 DIAGNOSIS — R002 Palpitations: Secondary | ICD-10-CM

## 2019-08-09 DIAGNOSIS — I493 Ventricular premature depolarization: Secondary | ICD-10-CM

## 2019-08-09 DIAGNOSIS — R079 Chest pain, unspecified: Secondary | ICD-10-CM

## 2019-08-09 NOTE — Patient Instructions (Signed)
Medication Instructions:  Your physician recommends that you continue on your current medications as directed. Please refer to the Current Medication list given to you today.  If you need a refill on your cardiac medications before your next appointment, please call your pharmacy.   Lab work: None  If you have labs (blood work) drawn today and your tests are completely normal, you will receive your results only by: Patrick Gibbs MyChart Message (if you have MyChart) OR . A paper copy in the mail If you have any lab test that is abnormal or we need to change your treatment, we will call you to review the results.  Testing/Procedures: You had an EKG today.   Your physician has requested that you have an echocardiogram. Echocardiography is a painless test that uses sound waves to create images of your heart. It provides your doctor with information about the size and shape of your heart and how well your heart's chambers and valves are working. This procedure takes approximately one hour. There are no restrictions for this procedure.  Your physician has requested that you have a cardiac MRI. Cardiac MRI uses a computer to create images of your heart as its beating, producing both still and moving pictures of your heart and major blood vessels. For further information please visit [email or web address]. Please follow the instruction sheet given to you today for more information.  Your physician has recommended that you wear a ZIO monitor. ZIO monitors are medical devices that record the heart's electrical activity. Doctors most often use these monitors to diagnose arrhythmias. Arrhythmias are problems with the speed or rhythm of the heartbeat. The monitor is a small, portable device. You can wear one while you do your normal daily activities. This is usually used to diagnose what is causing palpitations/syncope (passing out). Wear for 7 days.   Follow-Up: At [facility], you and your health needs are our  priority.  As part of our continuing mission to provide you with exceptional heart care, we have created designated Provider Care Teams.  These Care Teams include your primary Cardiologist (physician) and Advanced Practice Providers (APPs -  Physician Assistants and Nurse Practitioners) who all work together to provide you with the care you need, when you need it. You will need a follow up appointment in 6 weeks.       Echocardiogram An echocardiogram is a procedure that uses painless sound waves (ultrasound) to produce an image of the heart. Images from an echocardiogram can provide important information about:  Signs of coronary artery disease (CAD).  Aneurysm detection. An aneurysm is a weak or damaged part of an artery wall that bulges out from the normal force of blood pumping through the body.  Heart size and shape. Changes in the size or shape of the heart can be associated with certain conditions, including heart failure, aneurysm, and CAD.  Heart muscle function.  Heart valve function.  Signs of a past heart attack.  Fluid buildup around the heart.  Thickening of the heart muscle.  A tumor or infectious growth around the heart valves. Tell a health care provider about:  Any allergies you have.  All medicines you are taking, including vitamins, herbs, eye drops, creams, and over-the-counter medicines.  Any blood disorders you have.  Any surgeries you have had.  Any medical conditions you have.  Whether you are pregnant or may be pregnant. What are the risks? Generally, this is a safe procedure. However, problems may occur, including:  Allergic reaction to dye (  contrast) that may be used during the procedure. What happens before the procedure? No specific preparation is needed. You may eat and drink normally. What happens during the procedure?   An IV tube may be inserted into one of your veins.  You may receive contrast through this tube. A contrast is an  injection that improves the quality of the pictures from your heart.  A gel will be applied to your chest.  A wand-like tool (transducer) will be moved over your chest. The gel will help to transmit the sound waves from the transducer.  The sound waves will harmlessly bounce off of your heart to allow the heart images to be captured in real-time motion. The images will be recorded on a computer. The procedure may vary among health care providers and hospitals. What happens after the procedure?  You may return to your normal, everyday life, including diet, activities, and medicines, unless your health care provider tells you not to do that. Summary  An echocardiogram is a procedure that uses painless sound waves (ultrasound) to produce an image of the heart.  Images from an echocardiogram can provide important information about the size and shape of your heart, heart muscle function, heart valve function, and fluid buildup around your heart.  You do not need to do anything to prepare before this procedure. You may eat and drink normally.  After the echocardiogram is completed, you may return to your normal, everyday life, unless your health care provider tells you not to do that. This information is not intended to replace advice given to you by your health care provider. Make sure you discuss any questions you have with your health care provider. Document Released: 10/23/2000 Document Revised: 02/16/2019 Document Reviewed: 11/28/2016 Elsevier Patient Education  2020 [employer].

## 2019-08-09 NOTE — Addendum Note (Signed)
Addended by: Austin Miles on: 08/09/2019 01:49 PM   Modules accepted: Orders

## 2019-08-14 ENCOUNTER — Telehealth: Payer: Self-pay | Admitting: Cardiology

## 2019-08-14 NOTE — Telephone Encounter (Signed)
Called patient he wanted to know when he would be scheduled for his cardiac mri and if we could do other mri's for his back pain and neck pain while he was in there. I informed him those are concerns he will need to take to his primary care as we are focusing on cardiac concerns for this mri. I also informed him that scheduling these can take some time due to the pre cert process it must go through with insurance. I let him know he will be  contacted when they are able to schedule it and also let him know he is more than welcome to call back if he wants to check on the process of it being scheduled. He verbally understood no further questions.

## 2019-08-14 NOTE — Telephone Encounter (Signed)
Has questions about his MRI

## 2019-08-16 ENCOUNTER — Ambulatory Visit (INDEPENDENT_AMBULATORY_CARE_PROVIDER_SITE_OTHER): Payer: Managed Care, Other (non HMO)

## 2019-08-16 ENCOUNTER — Ambulatory Visit (HOSPITAL_BASED_OUTPATIENT_CLINIC_OR_DEPARTMENT_OTHER)
Admission: RE | Admit: 2019-08-16 | Discharge: 2019-08-16 | Disposition: A | Discharge status: Home / Self Care | Payer: Managed Care, Other (non HMO) | Source: Ambulatory Visit | Attending: Cardiology | Admitting: Cardiology

## 2019-08-16 ENCOUNTER — Other Ambulatory Visit: Payer: Self-pay

## 2019-08-16 DIAGNOSIS — R0602 Shortness of breath: Secondary | ICD-10-CM

## 2019-08-16 DIAGNOSIS — Z7901 Long term (current) use of anticoagulants: Secondary | ICD-10-CM | POA: Diagnosis present

## 2019-08-16 DIAGNOSIS — R002 Palpitations: Secondary | ICD-10-CM

## 2019-08-16 DIAGNOSIS — R079 Chest pain, unspecified: Secondary | ICD-10-CM | POA: Diagnosis present

## 2019-08-16 DIAGNOSIS — I493 Ventricular premature depolarization: Secondary | ICD-10-CM

## 2019-08-16 NOTE — Progress Notes (Signed)
  Echocardiogram 2D Echocardiogram has been performed.  Patrick Gibbs 08/16/2019, 3:51 PM

## 2019-08-24 ENCOUNTER — Encounter: Payer: Self-pay | Admitting: *Deleted

## 2019-08-24 NOTE — Telephone Encounter (Signed)
I called patient to confirm appointment for Cardiac MRI schedule on 111/5/20 at 12:00pm at [facility].  Patient states he has finished wearing his monitor and now he is having dizzy spells and fast heart rate.  Would like for a nurse to call him.

## 2019-08-24 NOTE — Telephone Encounter (Signed)
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 He had left sided chest pain that radiated to his left shoulder and left jaw, he was dizzy, could feel his heart rate increasing and going back down, and his face was hot. He denies any symptoms now. Patient advised to go to the emergency department if symptoms came back. Patient verbally understands. Will consult with DOD if further instruction is needed.

## 2019-08-25 NOTE — Telephone Encounter (Signed)
To go to ER. I agree

## 2019-08-25 NOTE — Telephone Encounter (Signed)
Patient continues to have left sided chest pain that lasts only seconds and no other symptoms associated. Patient again directed to go to the emergency room if having active chest pain. He has went 4 times before for this and reports they always send him home. I listened and tried to explain why we suggest the emergency room for active chest pain. Patient verbally understood. But they are requesting a doctors recommendation for this as well.

## 2019-08-25 NOTE — Telephone Encounter (Signed)
Called patient back. Informed him of Dr. Julien Nordmann recommendation. He reports he is going to go to the emergency room if the pain comes back.

## 2019-08-30 ENCOUNTER — Other Ambulatory Visit (HOSPITAL_COMMUNITY): Payer: Managed Care, Other (non HMO)

## 2019-08-31 ENCOUNTER — Telehealth: Payer: Self-pay | Admitting: Cardiology

## 2019-08-31 NOTE — Telephone Encounter (Addendum)
Called patient he reports intermittent left sided chest pain that comes and goes with activity. He denies shortness of breath, radiation of pain or active chest pain. Patient advised to go to the emergency department if pain returns and was also scheduled to see Laurann Montana, NP tomorrow in [place]. Patient verbally understood. No further questions.

## 2019-08-31 NOTE — Telephone Encounter (Signed)
New Message   Pt c/o of Chest Pain: STAT if CP now or developed within 24 hours  1. Are you having CP right now? No, but the pain just stopped about 30 minutes ago  2. Are you experiencing any other symptoms (ex. SOB, nausea, vomiting, sweating)? no  3. How long have you been experiencing CP?  On and off for a while but has been occurring more the past few days.    4. Is your CP continuous or coming and going?coming and going   5. Have you taken Nitroglycerin? no  ?   Patient states that the chest pain has been coming more frequently. He states that any amount of stress causes him to have chest pain.

## 2019-08-31 NOTE — Telephone Encounter (Signed)
Thank you!!  Vuong Musa S Kegan Mckeithan, NP  

## 2019-09-01 ENCOUNTER — Encounter: Payer: Self-pay | Admitting: Family

## 2019-09-01 ENCOUNTER — Ambulatory Visit (INDEPENDENT_AMBULATORY_CARE_PROVIDER_SITE_OTHER): Payer: Managed Care, Other (non HMO) | Admitting: Family

## 2019-09-01 ENCOUNTER — Other Ambulatory Visit: Payer: Self-pay

## 2019-09-01 VITALS — BP 126/78 | HR 71 | Ht 69.0 in | Wt 214.0 lb

## 2019-09-01 DIAGNOSIS — F418 Other specified anxiety disorders: Secondary | ICD-10-CM

## 2019-09-01 DIAGNOSIS — R0601 Orthopnea: Secondary | ICD-10-CM | POA: Diagnosis not present

## 2019-09-01 DIAGNOSIS — Z7901 Long term (current) use of anticoagulants: Secondary | ICD-10-CM

## 2019-09-01 DIAGNOSIS — R079 Chest pain, unspecified: Secondary | ICD-10-CM | POA: Diagnosis not present

## 2019-09-01 DIAGNOSIS — J449 Chronic obstructive pulmonary disease, unspecified: Secondary | ICD-10-CM

## 2019-09-01 DIAGNOSIS — I493 Ventricular premature depolarization: Secondary | ICD-10-CM

## 2019-09-01 NOTE — Progress Notes (Signed)
Office Visit    Patient Name: Patrick BameDavid Tudisco Date of Encounter: 09/01/2019  Primary Care Provider:  Leola BrazilEverly, Rebecca B, DO Primary Cardiologist:  No primary care provider on file. Electrophysiologist:  None   Chief Complaint    Patrick Gibbs is a 53 y.o. male with a hx of PE on anticoagulation, COPD, anxiety, HLD, insomnia, chest pain presents today for recurrent chest pain.   Past Medical History    Past Medical History:  Diagnosis Date  . Anterior neck pain   . Anticoagulated   . Anxiety   . Chronic fatigue   . Class 1 obesity due to excess calories with serious comorbidity and body mass index (BMI) of 30.0 to 30.9 in adult   . COPD (chronic obstructive pulmonary disease) (HCC)   . Erectile dysfunction   . History of pulmonary embolus (PE)   . Hyperlipidemia   . Hyperopia with presbyopia of both eyes   . Keratoconjunctivitis of both eyes   . Mild concentric left ventricular hypertrophy (LVH)   . Nocturnal leg cramps   . Palmar wart   . Palpitations   . Precordial chest pain   . PVC's (premature ventricular contractions)   . Rosacea   . Vitreous floater, bilateral    Past Surgical History:  Procedure Laterality Date  . CARDIAC CATHETERIZATION  12/2018  . TONSILLECTOMY      Allergies  Allergies  Allergen Reactions  . Ciprofloxacin Itching  . Escitalopram     Other reaction(s): Dizziness (intolerance) Almost passed out  . Cymbalta [Duloxetine Hcl] Other (See Comments)    "thought I was going to die-whole body started burning"    History of Present Illness    Patrick BameDavid Mccuen is a 53 y.o. male with a hx of PE on anticoagultion, COPD, anxiety, HLD, insomnia, chest pain, PVC last seen by Dr. Dulce SellarMunley 08/09/19. Additional hx superficial thrombophlebitis LLE 2015. 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 Hypercoagulable evaluation by hematology was normal.   Previous  chest pain workup includes cardiac cath [facility] with no significant CAD. Previous cath 05/2013 with no CAD and normal EF. He remains very concerned about underlying heart disease despite reassurance. Is frustrated that he "can't do anything active". Chest pain episodes occur primarily at rest, last a number of hours. Dr. Dulce SellarMunley has recommended him to undergo cardiac MRI to exclude myocarditis.   Called our office 08/31/19 with report of continued intermittent L sided chest pain. He was scheduled for follow up today. Tells me the pain occurs at rest and with activity. No clear pattern. No noted aggravating nor relieving factors. Not associated with diaphoresis or shortness of breath.   He has upcoming cardiac MRI 09/14/19. He had to reschedule the initial date as it was the same date as his colonoscopy.   He underwent ZIO monitor for hx of PVC but report is not yet available. Reassurance provided again that his previous cardiac studies look good. We revied his echo 08/16/19 in detail and reassurance was provided that there are no signs of heart failure. He does endorse some orthopnea and we discussed this could be due to his COPD - he does not use his inhalers. Echo 08/16/19 with EF 55-60%, normal diastolic function, normal RV, RA mildly dilated, trace MR, mild TR, normal pulmonary artery systolic pressure, pericardial fat pad.  We discussed that despite the pain being "right over his heart" there the pain  could be his heart, lungs, ribs, or muscles as they are all in that anatomical region.   EKGs/Labs/Other Studies Reviewed:   The following studies were reviewed today: Echo 08/16/19  1. Left ventricular ejection fraction, by visual estimation, is 55 to 60%. The left ventricle has normal function. Normal left ventricular size. There is no left ventricular hypertrophy.  2. Normal Diastolic Function.  3. Global right ventricle has normal systolic function.The right ventricular size is normal. No  increase in right ventricular wall thickness.  4. Left atrial size was normal.  5. Right atrial size was mildly dilated.  6. The mitral valve is normal in structure. Trace mitral valve regurgitation. No evidence of mitral stenosis.  7. The tricuspid valve is normal in structure. Tricuspid valve regurgitation is mild.  8. The aortic valve is normal in structure. Aortic valve regurgitation was not visualized by color flow Doppler. Structurally normal aortic valve, with no evidence of sclerosis or stenosis.  9. The pulmonic valve was normal in structure. Pulmonic valve regurgitation is not visualized by color flow Doppler. 10. Normal pulmonary artery systolic pressure. 11. Presence of pericardial fat pad.  EKG:  EKG is ordered today.  The ekg ordered today demonstrates SR ith PVC no acute ST/T wave changes.   Recent Labs: No results found for requested labs within last 8760 hours.  Recent Lipid Panel No results found for: CHOL, TRIG, HDL, CHOLHDL, VLDL, LDLCALC, LDLDIRECT  Home Medications   Current Meds  Medication Sig  . acetaminophen (TYLENOL) 500 MG tablet Take 1,000 mg by mouth daily as needed.  . Enoxaparin Sodium (LOVENOX [place]) Inject 140 mg into the skin.  Marland Kitchen LORazepam (ATIVAN) 1 MG tablet Take 0.5 mg by mouth 2 (two) times daily.  . Tiotropium Bromide Monohydrate (SPIRIVA RESPIMAT) 1.25 MCG/ACT AERS Inhale 1 puff into the lungs as needed.  . warfarin (COUMADIN) 5 MG tablet Take 5 mg by mouth as directed. Take 3 tablets every evening      Review of Systems       Review of Systems  Constitution: Negative for chills, fever and malaise/fatigue.  Cardiovascular: Positive for chest pain, orthopnea and palpitations. Negative for dyspnea on exertion, irregular heartbeat, leg swelling and near-syncope.  Respiratory: Negative for cough, shortness of breath and wheezing.   Musculoskeletal: Positive for back pain.  Gastrointestinal: Negative for nausea and vomiting.  Neurological:  Negative for dizziness, light-headedness and weakness.   All other systems reviewed and are otherwise negative except as noted above.  Physical Exam    VS:  BP 126/78 (BP Location: Right Arm, Patient Position: Sitting, Cuff Size: Large)   Pulse 71   Ht 5\' 9"  (1.753 m)   Wt 214 lb (97.1 kg)   SpO2 98%   BMI 31.60 kg/m  , BMI Body mass index is 31.6 kg/m. GEN: Well nourished, well developed, in no acute distress. HEENT: normal. Neck: Supple, no JVD, carotid bruits, or masses. Cardiac: RRR, no murmurs, rubs, or gallops. No clubbing, cyanosis, edema.  Radials/DP/PT 2+ and equal bilaterally.  Respiratory:  Respirations regular and unlabored, clear to auscultation bilaterally. GI: Soft, nontender, nondistended, BS + x 4. MS: No deformity or atrophy. Skin: Warm and dry, no rash. Neuro:  Strength and sensation are intact. Psych: Normal affect.  Assessment & Plan    1. Chest pain in adult - Atypical features of angina. Occurs at rest, no SOB, no diaphoresis. Cath 12/2018 at [facility] no significant CAD. EKG today SR with PVC no acute ST/T wave changes.  No indication for repeat ischemic eval at this time. Echo 08/2019 with no significant valvular abnormalities, normal EF. Low suspicion PE as he is on chronic anticoagulation with good INR results. Upcoming cardiac MRI to rule out myocarditis.   2. Orthopnea - Echo 08/2019 with EF 55-605, normal diastolic, no significant valvular abnormality. Reassurance that there is no evidence of HF. Likely due to his COPD.   3. COPD - Not seen by pulmonary recently. Tells me he does not use his inhaler due to side effects. Encouraged to re-establish care with pulmonology after cardiac MRI results.   4. Anxiety - Known hx. Tells me he did have some improvement in symptoms after self-increasing his Ativan for one dose. Encouraged to follow up with PCP. Encouraged stress reduction by seeing a therapist, regular exercise, meditation, yoga. Many chest pain episodes  have been triggered by stress.  Consider for etiology of some of his episodes of chest pain.   5. PVC - Await result of recent ZIO. Pending result may benefit from PVC and anxiety standpoint by addition of beta blocker.   6. 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 Reports compliance with his anticoagulation. Recent Lovenox bridge for colonoscopy.  Disposition: Follow up in 3 week(s) with Dr. Dulce Sellar after cardiac MRI.   Alver Sorrow, NP 09/01/2019, 5:23 PM

## 2019-09-01 NOTE — Patient Instructions (Signed)
Medication Instructions:  Your physician recommends that you continue on your current medications as directed. Please refer to the Current Medication list given to you today.  *If you need a refill on your cardiac medications before your next appointment, please call your pharmacy*  Lab Work: None  If you have labs (blood work) drawn today and your tests are completely normal, you will receive your results only by: Marland Kitchen MyChart Message (if you have MyChart) OR . A paper copy in the mail If you have any lab test that is abnormal or we need to change your treatment, we will call you to review the results.  Testing/Procedures: You had an EKG today.   Follow-Up: At [facility], you and your health needs are our priority.  As part of our continuing mission to provide you with exceptional heart care, we have created designated Provider Care Teams.  These Care Teams include your primary Cardiologist (physician) and Advanced Practice Providers (APPs -  Physician Assistants and Nurse Practitioners) who all work together to provide you with the care you need, when you need it.  Your next appointment:   1 month   The format for your next appointment:   In Person  Provider:   Shirlee More, MD

## 2019-09-02 ENCOUNTER — Encounter: Payer: Self-pay | Admitting: Family

## 2019-09-13 ENCOUNTER — Telehealth (HOSPITAL_COMMUNITY): Payer: Self-pay | Admitting: Emergency Medicine

## 2019-09-13 NOTE — Telephone Encounter (Signed)
Reaching out to patient to offer assistance regarding upcoming cardiac imaging study; pt verbalizes understanding of appt date/time, parking situation and where to check in, and verified current allergies; name and call back number provided for further questions should they arise Marchia Bond RN Navigator Cardiac Imaging [facility] [phone] office [phone] cell  Pt denies implanted devices/metal Pt denies claustrophobia

## 2019-09-14 ENCOUNTER — Ambulatory Visit (HOSPITAL_COMMUNITY)
Admission: RE | Admit: 2019-09-14 | Discharge: 2019-09-14 | Disposition: A | Discharge status: Home / Self Care | Payer: Managed Care, Other (non HMO) | Source: Ambulatory Visit | Attending: Cardiology | Admitting: Cardiology

## 2019-09-14 ENCOUNTER — Other Ambulatory Visit: Payer: Self-pay

## 2019-09-14 DIAGNOSIS — R0602 Shortness of breath: Secondary | ICD-10-CM | POA: Insufficient documentation

## 2019-09-14 DIAGNOSIS — R002 Palpitations: Secondary | ICD-10-CM | POA: Diagnosis present

## 2019-09-14 DIAGNOSIS — R079 Chest pain, unspecified: Secondary | ICD-10-CM | POA: Diagnosis not present

## 2019-09-14 MED ORDER — GADOBUTROL 1 MMOL/ML IV SOLN
12.0000 mL | Freq: Once | INTRAVENOUS | Status: AC | PRN
  Administered 2019-09-14: 12 mL via INTRAVENOUS

## 2019-09-20 ENCOUNTER — Ambulatory Visit (INDEPENDENT_AMBULATORY_CARE_PROVIDER_SITE_OTHER): Payer: Managed Care, Other (non HMO) | Admitting: Cardiology

## 2019-09-20 ENCOUNTER — Encounter: Payer: Self-pay | Admitting: Cardiology

## 2019-09-20 ENCOUNTER — Other Ambulatory Visit: Payer: Self-pay

## 2019-09-20 VITALS — BP 124/90 | HR 71 | Ht 69.0 in | Wt 218.4 lb

## 2019-09-20 DIAGNOSIS — Z7901 Long term (current) use of anticoagulants: Secondary | ICD-10-CM

## 2019-09-20 DIAGNOSIS — R0602 Shortness of breath: Secondary | ICD-10-CM

## 2019-09-20 DIAGNOSIS — R079 Chest pain, unspecified: Secondary | ICD-10-CM

## 2019-09-20 NOTE — Progress Notes (Signed)
Cardiology Office Note:    Date:  09/20/2019   ID:  Patrick Gibbs, DOB 11/19/1965, MRN [id number]  PCP:  Patrick Girt, DO  Cardiologist:  Patrick More, MD    Referring MD: Patrick Girt, DO    ASSESSMENT:    1. Chest pain in adult   2. Chronic anticoagulation   3. Shortness of breath    PLAN:    In order of problems listed above:  1. I reassured him to the best my ability is no evidence of significant heart disease I think most of his symptoms of high-level exercise intolerance shortness of breath or consequence of pulmonary embolism they should be able to live a normal life be involved in regular exercise program work and hopefully would improve with time.  He seems comfortable with this knowledge 2. Continue long-term anticoagulation I asked about direct anticoagulants and he said that he had a bad experience with diffuse muscle weakness and wants to remain on warfarin 3. Improving he has residual shortness of breath due to previous pulmonary embolism   Next appointment: As needed   Medication Adjustments/Labs and Tests Ordered: Current medicines are reviewed at length with the patient today.  Concerns regarding medicines are outlined above.  No orders of the defined types were placed in this encounter.  No orders of the defined types were placed in this encounter.   Chief Complaint  Patient presents with  . Follow-up    After cardiac MR  . Chest Pain    History of Present Illness:    Patrick Gibbs is a 53 y.o. male with a hx of PE on anticoagulation, COPD, anxiety, HLD, insomnia, chest pain  last seen 09/01/2019 with recurrent chest pain. Compliance with diet, lifestyle and medications: Yes  History of superficial thrombophlebitis LLE 2015. Subsequently 2018 developed back and L chest pain, pleuritic in nature with hemoptysis - in ED found to have bilateral LL PE without right heart strain. Initiated anticoagulation with Eliquis. At that time LE venous  duplex negative bilaterally. Has been seen by hematology and his hypercoagulable evaluation was norma.    Previous chest pain workup includes cardiac cath 12/15/18 at [facility] with no significant CAD. Cardiac cath 05/2013 with no CAD and normal EF. He has had substantial cardiac workup detailed in Care Everywhere.  Prominent complaints of palpitation and PVC he underwent further evaluation including a ZIO monitor cardiac MR for myocarditis  Reviewed his MRI with him.  Cardiac MRI: IMPRESSION: 1. Moderate LAE mild RAE  2.  Normal RV size and function no evidence of RV dysplasia  3.  Normal LV size and function quantitative EF 62%  4.  No evidence of myocarditis no delayed gadolinium uptake  5.  Normal MV, TV and AV  6.  No pericardial effusion  7.  Normal aortic root 3.3 cm  Cardiac echo 08/16/2019: Note by echo left atrium is normal in volume right atrium is mildly enlarged 1. Left ventricular ejection fraction, by visual estimation, is 55 to 60%. The left ventricle has normal function. Normal left ventricular size. There is no left ventricular hypertrophy.  2. Normal Diastolic Function.  3. Global right ventricle has normal systolic function.The right ventricular size is normal. No increase in right ventricular wall thickness.  4. Left atrial size was normal.  5. Right atrial size was mildly dilated.  6. The mitral valve is normal in structure. Trace mitral valve regurgitation. No evidence of mitral stenosis.  7. The tricuspid valve is normal in structure.  Tricuspid valve regurgitation is mild.  8. The aortic valve is normal in structure. Aortic valve regurgitation was not visualized by color flow Doppler. Structurally normal aortic valve, with no evidence of sclerosis or stenosis.  9. The pulmonic valve was normal in structure. Pulmonic valve regurgitation is not visualized by color flow Doppler. 10. Normal pulmonary artery systolic pressure. 11. Presence of pericardial fat  pad.  ZIO Study Highlights A ZIO monitor was performed for 7 days beginning 08/16/2019 to assess frequent PVCs. Rhythm throughout is sinus with minimum average and maximum heart rates of 44, 68 and 142 bpm. No pauses of 3 seconds or greater and no episodes of AV node or sinus node block. Ventricular ectopy was rare with isolated PVCs and couplets.  Longest episode of bigeminy was 10.4 seconds, trigeminy 22.3 seconds. Supraventricular ectopy overall was rare.  There were 4 brief runs of atrial premature contractions the longest 11 complexes a rate of 117 bpm consistent with atrial tachycardia.  There are no episodes of atrial fibrillation or flutter. There were 27 triggered events the majority were associated with ventricular and less frequently supraventricular premature beats.  The episodes of atrial tachycardia were not triggered.   He is really searching for a reason why he is unable to physically do the activities he did before and still has atypical back discomfort since a pulmonary embolism.  To the best of my ability he does not have evidence of coronary artery disease valvular heart disease or cardiomyopathy or myocarditis.  I think unfortunately as a sequelae of his pulmonary embolism he can live a normal life but I think the symptoms are going to persist and he is comfortable with that approach he is back into an exercise program.  He will continue his anticoagulation only further follow-up in my office as needed Past Medical History:  Diagnosis Date  . Anterior neck pain   . Anticoagulated   . Anxiety   . Chronic fatigue   . Class 1 obesity due to excess calories with serious comorbidity and body mass index (BMI) of 30.0 to 30.9 in adult   . COPD (chronic obstructive pulmonary disease) (HCC)   . Erectile dysfunction   . History of pulmonary embolus (PE)   . Hyperlipidemia   . Hyperopia with presbyopia of both eyes   . Keratoconjunctivitis of both eyes   . Mild concentric left  ventricular hypertrophy (LVH)   . Nocturnal leg cramps   . Palmar wart   . Palpitations   . Precordial chest pain   . PVC's (premature ventricular contractions)   . Rosacea   . Vitreous floater, bilateral     Past Surgical History:  Procedure Laterality Date  . CARDIAC CATHETERIZATION  12/2018  . COLONOSCOPY    . ESOPHAGOGASTRODUODENOSCOPY    . TONSILLECTOMY      Current Medications: Current Meds  Medication Sig  . acetaminophen (TYLENOL) 500 MG tablet Take 1,000 mg by mouth daily as needed.  Marland Kitchen. LORazepam (ATIVAN) 1 MG tablet Take 0.5 mg by mouth 2 (two) times daily.  . Tiotropium Bromide Monohydrate (SPIRIVA RESPIMAT) 1.25 MCG/ACT AERS Inhale 1 puff into the lungs as needed.  . warfarin (COUMADIN) 5 MG tablet TAKE 2 TABLETS BY MOUTH DAILY AT 6 PM, EXCEPT 2 AND 1/2 TABLETS ON MONDAY, WEDNESDAY, FRIDAY AS DIRECTED     Allergies:   Ciprofloxacin, Escitalopram, and Cymbalta [duloxetine hcl]   Social History   Socioeconomic History  . Marital status: Single    Spouse name: Not on  file  . Number of children: Not on file  . Years of education: Not on file  . Highest education level: Not on file  Occupational History  . Not on file  Social Needs  . Financial resource strain: Not on file  . Food insecurity    Worry: Not on file    Inability: Not on file  . Transportation needs    Medical: Not on file    Non-medical: Not on file  Tobacco Use  . Smoking status: Former Smoker    Packs/day: 1.00    Years: 0.50    Pack years: 0.50    Types: Cigarettes    Quit date: 2008    Years since quitting: 12.8  . Smokeless tobacco: Never Used  Substance and Sexual Activity  . Alcohol use: Yes    Comment: rarely  . Drug use: Never  . Sexual activity: Not on file  Lifestyle  . Physical activity    Days per week: Not on file    Minutes per session: Not on file  . Stress: Not on file  Relationships  . Social Musician on phone: Not on file    Gets together: Not on  file    Attends religious service: Not on file    Active member of club or organization: Not on file    Attends meetings of clubs or organizations: Not on file    Relationship status: Not on file  Other Topics Concern  . Not on file  Social History Narrative  . Not on file     Family History: The patient's family history includes CAD in his mother; Cataracts in his father, maternal grandmother, mother, paternal grandfather, and paternal grandmother; Diabetes in his father; Heart attack in his maternal uncle and maternal uncle. ROS:   Please see the history of present illness.    All other systems reviewed and are negative.  EKGs/Labs/Other Studies Reviewed:    The following studies were reviewed today:  Recent Labs: No results found for requested labs within last 8760 hours.  Recent Lipid Panel No results found for: CHOL, TRIG, HDL, CHOLHDL, VLDL, LDLCALC, LDLDIRECT  Physical Exam:    VS:  BP 124/90 (BP Location: Right Arm, Patient Position: Sitting, Cuff Size: Large)   Pulse 97   Ht 5\' 9"  (1.753 m)   Wt 218 lb 6.4 oz (99.1 kg)   SpO2 (!) 71%   BMI 32.25 kg/m     Wt Readings from Last 3 Encounters:  09/20/19 218 lb 6.4 oz (99.1 kg)  09/01/19 214 lb (97.1 kg)  08/09/19 215 lb (97.5 kg)     GEN:  Well nourished, well developed in no acute distress HEENT: Normal NECK: No JVD; No carotid bruits LYMPHATICS: No lymphadenopathy CARDIAC: =RRR, no murmurs, rubs, gallops RESPIRATORY:  Clear to auscultation without rales, wheezing or rhonchi  ABDOMEN: Soft, non-tender, non-distended MUSCULOSKELETAL:  No edema; No deformity  SKIN: Warm and dry NEUROLOGIC:  Alert and oriented x 3 PSYCHIATRIC:  Normal affect    Signed, 08/11/19, MD  09/20/2019 4:46 PM    [facility]

## 2019-09-20 NOTE — Patient Instructions (Addendum)
Medication Instructions:  Your physician recommends that you continue on your current medications as directed. Please refer to the Current Medication list given to you today.  *If you need a refill on your cardiac medications before your next appointment, please call your pharmacy*  Lab Work: NONE If you have labs (blood work) drawn today and your tests are completely normal, you will receive your results only by: Marland Kitchen MyChart Message (if you have MyChart) OR . A paper copy in the mail If you have any lab test that is abnormal or we need to change your treatment, we will call you to review the results.  Testing/Procedures: NONE Follow-Up: At [facility], you and your health needs are our priority.  As part of our continuing mission to provide you with exceptional heart care, we have created designated Provider Care Teams.  These Care Teams include your primary Cardiologist (physician) and Advanced Practice Providers (APPs -  Physician Assistants and Nurse Practitioners) who all work together to provide you with the care you need, when you need it.  Your next appointment:   You will follow up with Dr Bettina Gavia as needed if symptoms worsen or fail to improve.   The format for your next appointment:   In Person  Provider:   Shirlee More, MD

## 2020-02-26 ENCOUNTER — Telehealth: Payer: Self-pay | Admitting: Cardiology

## 2020-02-26 NOTE — Telephone Encounter (Signed)
Pt c/o of Chest Pain: STAT if CP now or developed within 24 hours  1. Are you having CP right now? no  2. Are you experiencing any other symptoms (ex. SOB, nausea, vomiting, sweating)? no  3. How long have you been experiencing CP? 6 weeks or so  4. Is your CP continuous or coming and going? Comes and goes  5. Have you taken Nitroglycerin? No   Pt had an appt with Dr. Dulce Sellar in November and at his last appt the patient was cleared. The pt has spells of intermittent CP that have come and go over the past 6 weeks. He just wanted to be checked out by Dr. Dulce Sellar again  ?

## 2020-02-27 NOTE — Telephone Encounter (Signed)
Tried calling patient. No answer and voicemail is to full for me to leave a message.

## 2020-02-27 NOTE — Telephone Encounter (Signed)
Please have him seen this week at [facility] office we all cross covet each other there

## 2020-02-28 NOTE — Telephone Encounter (Signed)
Spoke with the patient just now and let him know Dr. Hulen Shouts recommendation. The patient is scheduled to see Dr. Bing Matter in [place] on 03/01/20 at 10:40 AM.   Encouraged patient to call back with any questions or concerns.

## 2020-03-01 ENCOUNTER — Other Ambulatory Visit: Payer: Self-pay

## 2020-03-01 ENCOUNTER — Encounter: Payer: Self-pay | Admitting: Cardiology

## 2020-03-01 ENCOUNTER — Ambulatory Visit: Payer: Managed Care, Other (non HMO) | Admitting: Cardiology

## 2020-03-01 VITALS — BP 120/82 | HR 70 | Ht 69.0 in | Wt 213.0 lb

## 2020-03-01 DIAGNOSIS — I2699 Other pulmonary embolism without acute cor pulmonale: Secondary | ICD-10-CM

## 2020-03-01 DIAGNOSIS — I517 Cardiomegaly: Secondary | ICD-10-CM

## 2020-03-01 DIAGNOSIS — F418 Other specified anxiety disorders: Secondary | ICD-10-CM

## 2020-03-01 DIAGNOSIS — J449 Chronic obstructive pulmonary disease, unspecified: Secondary | ICD-10-CM

## 2020-03-01 DIAGNOSIS — R4589 Other symptoms and signs involving emotional state: Secondary | ICD-10-CM

## 2020-03-01 DIAGNOSIS — E782 Mixed hyperlipidemia: Secondary | ICD-10-CM

## 2020-03-01 MED ORDER — RANOLAZINE ER 500 MG PO TB12: 500.0000 mg | ORAL_TABLET | Freq: Two times a day (BID) | ORAL | 6 refills | Status: DC

## 2020-03-01 NOTE — Patient Instructions (Signed)
Medication Instructions:  Your physician has recommended you make the following change in your medication: 1. Start Ranexa one tablet ( 500 mg) twice a day. Sent in to requested pharmacy.    Labwork: Your physician recommends that you have FASTING lipid profile today.    Testing/Procedures: -None  Follow-Up: Your physician recommends that you keep your scheduled  follow-up appointment with Dr. Dulce Sellar in [place].    Any Other Special Instructions Will Be Listed Below (If Applicable).     If you need a refill on your cardiac medications before your next appointment, please call your pharmacy.

## 2020-03-01 NOTE — Progress Notes (Signed)
Cardiology Office Note:    Date:  03/01/2020   ID:  Patrick Gibbs, DOB 11/02/1966, MRN [id number]  PCP:  Nicola Girt, DO  Cardiologist:  Jenne Campus, MD    Referring MD: Nicola Girt, DO   No chief complaint on file. I have a lot of issues  History of Present Illness:    Patrick Gibbs is a 54 y.o. male  with a hx of PE on anticoagulation, COPD, anxiety, HLD, insomnia, chest pain  last seen 09/01/2019 with recurrent chest pain. Compliance with diet, lifestyle and medications: Yes  History of superficial thrombophlebitis LLE 2015. Subsequently 2018 developed back and L chest pain, pleuritic in naturewith hemoptysis - in ED found to have bilateral LL PE without right heart strain. Initiated anticoagulation with Eliquis. At that time LE venous duplex negative bilaterally. Has been seen by hematology and his hypercoagulable evaluation was norma.   Previous chest pain workup includes cardiac cath 12/15/18 at [facility] with no significant CAD. Cardiac cath 05/2013 with no CAD and normal EF. He has had substantial cardiac workup detailed in Care Everywhere.  Prominent complaints of palpitation and PVC he underwent further evaluation including a ZIO monitor cardiac MR for myocarditis  Reviewed his MRI with him.  Cardiac MRI: IMPRESSION: 1. Moderate LAE mild RAE  2. Normal RV size and function no evidence of RV dysplasia  3. Normal LV size and function quantitative EF 62%  4. No evidence of myocarditis no delayed gadolinium uptake  5. Normal MV, TV and AV  6. No pericardial effusion  7. Normal aortic root 3.3 cm  Cardiac echo 08/16/2019: Note by echo left atrium is normal in volume right atrium is mildly enlarged1. Left ventricular ejection fraction, by visual estimation, is 55 to 60%. The left ventricle has normal function. Normal left ventricular size. There is no left ventricular hypertrophy. 2. Normal Diastolic Function. 3. Global right ventricle  has normal systolic function.The right ventricular size is normal. No increase in right ventricular wall thickness. 4. Left atrial size was normal. 5. Right atrial size was mildly dilated. 6. The mitral valve is normal in structure. Trace mitral valve regurgitation. No evidence of mitral stenosis. 7. The tricuspid valve is normal in structure. Tricuspid valve regurgitation is mild. 8. The aortic valve is normal in structure. Aortic valve regurgitation was not visualized by color flow Doppler. Structurally normal aortic valve, with no evidence of sclerosis or stenosis. 9. The pulmonic valve was normal in structure. Pulmonic valve regurgitation is not visualized by color flow Doppler. 10. Normal pulmonary artery systolic pressure. 11. Presence of pericardial fat pad.  ZIO Study Highlights A ZIO monitor was performed for 7 days beginning 08/16/2019 to assess frequent PVCs. Rhythm throughout is sinus with minimum average and maximum heart rates of 44, 68 and 142 bpm. No pauses of 3 seconds or greater and no episodes of AV node or sinus node block. Ventricular ectopy was rare with isolated PVCs and couplets. Longest episode of bigeminy was 10.4 seconds, trigeminy 22.3 seconds. Supraventricular ectopy overall was rare. There were 4 brief runs of atrial premature contractions the longest 11 complexes a rate of 117 bpm consistent with atrial tachycardia. There are no episodes of atrial fibrillation or flutter. There were 27 triggered events the majority were associated with ventricular and less frequently supraventricular premature beats. The episodes of atrial tachycardia were not triggered   He comes today to my office with multiple complaints.  He still complain of having some chest pain.  He described  chest pain when he got some PVCs this is a momentary sensation with PVC, he also described to have some leg burning sensation when sometimes he bent forward.  He does have a dog and a walk dog  on the regular basis and he gets short of breath quite easily.  He is afraid to exercise because he is where he gets a problem.  He said this is a chronic sensation that been going on for about 2 years already.  Denies have any swelling of lower extremities.  He thinks he stops breathing at night.  He did have a sleep study done 2 years ago which showed no significant desaturation during the night.  He is always very concerned about his heart is where he gets significant problem.  Past Medical History:  Diagnosis Date  . Anterior neck pain   . Anticoagulated   . Anxiety   . Chronic fatigue   . Class 1 obesity due to excess calories with serious comorbidity and body mass index (BMI) of 30.0 to 30.9 in adult   . COPD (chronic obstructive pulmonary disease) (HCC)   . Erectile dysfunction   . History of pulmonary embolus (PE)   . Hyperlipidemia   . Hyperopia with presbyopia of both eyes   . Keratoconjunctivitis of both eyes   . Mild concentric left ventricular hypertrophy (LVH)   . Nocturnal leg cramps   . Palmar wart   . Palpitations   . Precordial chest pain   . PVC's (premature ventricular contractions)   . Rosacea   . Vitreous floater, bilateral     Past Surgical History:  Procedure Laterality Date  . CARDIAC CATHETERIZATION  12/2018  . COLONOSCOPY    . ESOPHAGOGASTRODUODENOSCOPY    . TONSILLECTOMY      Current Medications: Current Meds  Medication Sig  . LORazepam (ATIVAN) 1 MG tablet Take 0.5 mg by mouth 2 (two) times daily.  Marland Kitchen warfarin (COUMADIN) 5 MG tablet TAKE 2 TABLETS BY MOUTH DAILY AT 6 PM, EXCEPT 2 AND 1/2 TABLETS ON MONDAY, WEDNESDAY, FRIDAY AS DIRECTED     Allergies:   Ciprofloxacin, Escitalopram, and Cymbalta [duloxetine hcl]   Social History   Socioeconomic History  . Marital status: Single    Spouse name: Not on file  . Number of children: Not on file  . Years of education: Not on file  . Highest education level: Not on file  Occupational History  .  Not on file  Tobacco Use  . Smoking status: Former Smoker    Packs/day: 1.00    Years: 0.50    Pack years: 0.50    Types: Cigarettes    Quit date: 2008    Years since quitting: 13.3  . Smokeless tobacco: Never Used  Substance and Sexual Activity  . Alcohol use: Yes    Comment: rarely  . Drug use: Never  . Sexual activity: Not on file  Other Topics Concern  . Not on file  Social History Narrative  . Not on file   Social Determinants of Health   Financial Resource Strain:   . Difficulty of Paying Living Expenses:   Food Insecurity:   . Worried About Programme researcher, broadcasting/film/video in the Last Year:   . Barista in the Last Year:   Transportation Needs:   . Freight forwarder (Medical):   Marland Kitchen Lack of Transportation (Non-Medical):   Physical Activity:   . Days of Exercise per Week:   . Minutes of Exercise per  Session:   Stress:   . Feeling of Stress :   Social Connections:   . Frequency of Communication with Friends and Family:   . Frequency of Social Gatherings with Friends and Family:   . Attends Religious Services:   . Active Member of Clubs or Organizations:   . Attends Banker Meetings:   Marland Kitchen Marital Status:      Family History: The patient's family history includes CAD in his mother; Cataracts in his father, maternal grandmother, mother, paternal grandfather, and paternal grandmother; Diabetes in his father; Heart attack in his maternal uncle and maternal uncle. ROS:   Please see the history of present illness.    All 14 point review of systems negative except as described per history of present illness  EKGs/Labs/Other Studies Reviewed:      Recent Labs: No results found for requested labs within last 8760 hours.  Recent Lipid Panel No results found for: CHOL, TRIG, HDL, CHOLHDL, VLDL, LDLCALC, LDLDIRECT  Physical Exam:    VS:  BP 120/82   Pulse 70   Ht 5\' 9"  (1.753 m)   Wt 213 lb (96.6 kg)   SpO2 96%   BMI 31.45 kg/m     Wt Readings  from Last 3 Encounters:  03/01/20 213 lb (96.6 kg)  09/20/19 218 lb 6.4 oz (99.1 kg)  09/01/19 214 lb (97.1 kg)     GEN:  Well nourished, well developed in no acute distress HEENT: Normal NECK: No JVD; No carotid bruits LYMPHATICS: No lymphadenopathy CARDIAC: RRR, no murmurs, no rubs, no gallops RESPIRATORY:  Clear to auscultation without rales, wheezing or rhonchi  ABDOMEN: Soft, non-tender, non-distended MUSCULOSKELETAL:  No edema; No deformity  SKIN: Warm and dry LOWER EXTREMITIES: no swelling NEUROLOGIC:  Alert and oriented x 3 PSYCHIATRIC:  Normal affect   ASSESSMENT:    1. Bilateral pulmonary embolism (HCC)   2. Mild concentric left ventricular hypertrophy (LVH)   3. Stage 1 mild COPD by GOLD classification (HCC)   4. Anxiety about health   5. Mixed hyperlipidemia    PLAN:    In order of problems listed above:  1. History of bilateral pulmonary emboli.  Last echocardiogram done few months ago showed no significant pulmonary artery hypertension, he is right ventricle was normal in satisfaction, right atrium was mildly enlarged.09/03/19  He is anticoagulated which I will continue.  He tried on Eliquis before however developed some side effects he said he became paralyzed with this and that is why she is on Coumadin.  That being followed by his primary care physician. 2. Chest pain.  Does have a constellation of different chest pains.  2 cardiac catheterization showed nonobstructive disease.  I think there is some role of trying some antianginal medications just in case he gets some microvascular problem.  I put him on the ranolazine 500 mg twice daily. 3. COPD.  Noted.  Stable.  Only mild. 4. Anxiety about his health I think this is the leading problem here.  He may require some psychological support to help with this problem. 5. Dyslipidemia: We will schedule him today to have fasting lipid profile.   Medication Adjustments/Labs and Tests Ordered: Current medicines are reviewed  at length with the patient today.  Concerns regarding medicines are outlined above.  No orders of the defined types were placed in this encounter.  Medication changes: No orders of the defined types were placed in this encounter.   Signed, Marland Kitchen, MD, [place] [facility] 03/01/2020 11:05 AM  [facility]

## 2020-03-02 LAB — LIPID PANEL
Chol/HDL Ratio: 4.6 ratio (ref 0.0–5.0)
Cholesterol, Total: 147 mg/dL (ref 100–199)
HDL: 32 mg/dL — ABNORMAL LOW (ref >39)
LDL Chol Calc (NIH): 102 mg/dL — ABNORMAL HIGH (ref 0–99)
Triglycerides: 61 mg/dL (ref 0–149)
VLDL Cholesterol Cal: 13 mg/dL (ref 5–40)

## 2020-03-04 ENCOUNTER — Telehealth: Payer: Self-pay

## 2020-03-04 NOTE — Telephone Encounter (Signed)
-----   Message from Robert J Krasowski, MD sent at 03/03/2020  6:41 PM EDT ----- Fasting lipid profile showed mild elevation of LDL.  Diet and exercise at this stage 

## 2020-03-04 NOTE — Telephone Encounter (Signed)
Spoke with patient regarding results and recommendation.  Patient verbalizes understanding and is agreeable to plan of care. Advised patient to call back with any issues or concerns.  

## 2020-03-04 NOTE — Telephone Encounter (Signed)
Spoke to the patient in regards to these lab results and recommendations. The patient states that he does want to make Dr. Bing Matter aware that he had chest pain last night that lasted around 30 minutes. He took his Ativan 1 MG and said that this helped it go away. He states that he did not have any other symptoms. I am routing to Dr. Bing Matter to let him know.

## 2020-03-04 NOTE — Telephone Encounter (Signed)
-----   Message from Georgeanna Lea, MD sent at 03/03/2020  6:41 PM EDT ----- Fasting lipid profile showed mild elevation of LDL.  Diet and exercise at this stage

## 2020-03-05 NOTE — Telephone Encounter (Signed)
Dr. Dulce Sellar reviewed this note due to Dr. Bing Matter being out of the office this week. He states that he does not think there is anything that needs to be done at this time. We will keep Dr. Bing Matter aware of the situation.

## 2020-03-07 ENCOUNTER — Telehealth: Payer: Self-pay | Admitting: Cardiology

## 2020-03-07 NOTE — Telephone Encounter (Signed)
Called pt back but unable to leave a message as mailbox is full.

## 2020-03-07 NOTE — Telephone Encounter (Signed)
New Message    Pt is calling and says he saw Dr Bing Matter on 04/23 and was having some chest pains. He says Dr Kirtland Bouchard wanted him to have an EKG done but they didn't perform one, talked to him about some medications and then checked him out.  He says he is going to be traveling and is still having some of those same pains and is wanting to have something done to make sure he is okay     Please call

## 2020-03-07 NOTE — Telephone Encounter (Signed)
Pt states that he has intermittent chest pain. He was seen recently by Dr. Cephus Shelling and was told he was going to do an EKG but it was never done. Pt states that he has had 2 additional episodes of chest pain and he needs to know if it is he heart. Pt has had 2 cardiac caths with the last one in 2020. Pt states that the Ativan has helped. Appointment made with Dr. Servando Salina on 4/30.

## 2020-03-08 ENCOUNTER — Other Ambulatory Visit: Payer: Self-pay

## 2020-03-08 ENCOUNTER — Encounter: Payer: Self-pay | Admitting: Cardiology

## 2020-03-08 ENCOUNTER — Ambulatory Visit: Payer: Managed Care, Other (non HMO) | Admitting: Cardiology

## 2020-03-08 ENCOUNTER — Encounter: Payer: Self-pay | Admitting: Emergency Medicine

## 2020-03-08 VITALS — BP 120/84 | HR 64 | Ht 69.0 in | Wt 211.0 lb

## 2020-03-08 DIAGNOSIS — R5383 Other fatigue: Secondary | ICD-10-CM

## 2020-03-08 DIAGNOSIS — R0602 Shortness of breath: Secondary | ICD-10-CM

## 2020-03-08 DIAGNOSIS — R0789 Other chest pain: Secondary | ICD-10-CM

## 2020-03-08 DIAGNOSIS — E782 Mixed hyperlipidemia: Secondary | ICD-10-CM | POA: Diagnosis not present

## 2020-03-08 HISTORY — DX: Other chest pain: R07.89

## 2020-03-08 HISTORY — DX: Other fatigue: R53.83

## 2020-03-08 HISTORY — DX: Shortness of breath: R06.02

## 2020-03-08 MED ORDER — FUROSEMIDE 20 MG PO TABS
ORAL_TABLET | ORAL | 1 refills | Status: DC
Start: 2020-03-08 — End: 2020-05-24

## 2020-03-08 MED ORDER — POTASSIUM CHLORIDE ER 10 MEQ PO TBCR
EXTENDED_RELEASE_TABLET | ORAL | 1 refills | Status: DC
Start: 2020-03-08 — End: 2020-05-24

## 2020-03-08 NOTE — Patient Instructions (Addendum)
Medication Instructions:  Your physician has recommended you make the following change in your medication:   START: Lasix 20 mg on Tuesdays and Saturdays   START: Potassium 10 meq on Tuesdays and Saturdays    *If you need a refill on your cardiac medications before your next appointment, please call your pharmacy*   Lab Work: Your physician recommends that you return for lab work today: bmp, mg, tsh, bnp, vitamin d, vitamin b 12  If you have labs (blood work) drawn today and your tests are completely normal, you will receive your results only by: Marland Kitchen MyChart Message (if you have MyChart) OR . A paper copy in the mail If you have any lab test that is abnormal or we need to change your treatment, we will call you to review the results.   Testing/Procedures: Your physician has requested that you have an echocardiogram. Echocardiography is a painless test that uses sound waves to create images of your heart. It provides your doctor with information about the size and shape of your heart and how well your heart's chambers and valves are working. This procedure takes approximately one hour. There are no restrictions for this procedure.     Follow-Up: At [facility], you and your health needs are our priority.  As part of our continuing mission to provide you with exceptional heart care, we have created designated Provider Care Teams.  These Care Teams include your primary Cardiologist (physician) and Advanced Practice Providers (APPs -  Physician Assistants and Nurse Practitioners) who all work together to provide you with the care you need, when you need it.  We recommend signing up for the patient portal called "MyChart".  Sign up information is provided on this After Visit Summary.  MyChart is used to connect with patients for Virtual Visits (Telemedicine).  Patients are able to view lab/test results, encounter notes, upcoming appointments, etc.  Non-urgent messages can be sent to your  provider as well.   To learn more about what you can do with MyChart, go to [email or web address].    Your next appointment:   Follow up with Dr. Bettina Gavia    Other Instructions   Echocardiogram An echocardiogram is a procedure that uses painless sound waves (ultrasound) to produce an image of the heart. Images from an echocardiogram can provide important information about:  Signs of coronary artery disease (CAD).  Aneurysm detection. An aneurysm is a weak or damaged part of an artery wall that bulges out from the normal force of blood pumping through the body.  Heart size and shape. Changes in the size or shape of the heart can be associated with certain conditions, including heart failure, aneurysm, and CAD.  Heart muscle function.  Heart valve function.  Signs of a past heart attack.  Fluid buildup around the heart.  Thickening of the heart muscle.  A tumor or infectious growth around the heart valves. Tell a health care provider about:  Any allergies you have.  All medicines you are taking, including vitamins, herbs, eye drops, creams, and over-the-counter medicines.  Any blood disorders you have.  Any surgeries you have had.  Any medical conditions you have.  Whether you are pregnant or may be pregnant. What are the risks? Generally, this is a safe procedure. However, problems may occur, including:  Allergic reaction to dye (contrast) that may be used during the procedure. What happens before the procedure? No specific preparation is needed. You may eat and drink normally. What happens during the procedure?  An IV tube may be inserted into one of your veins.  You may receive contrast through this tube. A contrast is an injection that improves the quality of the pictures from your heart.  A gel will be applied to your chest.  A wand-like tool (transducer) will be moved over your chest. The gel will help to transmit the sound waves from the  transducer.  The sound waves will harmlessly bounce off of your heart to allow the heart images to be captured in real-time motion. The images will be recorded on a computer. The procedure may vary among health care providers and hospitals. What happens after the procedure?  You may return to your normal, everyday life, including diet, activities, and medicines, unless your health care provider tells you not to do that. Summary  An echocardiogram is a procedure that uses painless sound waves (ultrasound) to produce an image of the heart.  Images from an echocardiogram can provide important information about the size and shape of your heart, heart muscle function, heart valve function, and fluid buildup around your heart.  You do not need to do anything to prepare before this procedure. You may eat and drink normally.  After the echocardiogram is completed, you may return to your normal, everyday life, unless your health care provider tells you not to do that. This information is not intended to replace advice given to you by your health care provider. Make sure you discuss any questions you have with your health care provider. Document Revised: 02/16/2019 Document Reviewed: 11/28/2016 Elsevier Patient Education  [place].  Furosemide Oral Tablets What is this medicine? FUROSEMIDE (fyoor [place] se mide) is a diuretic. It helps you make more urine and to lose salt and excess water from your body. It treats swelling from heart, kidney, or liver disease. It also treats high blood pressure. This medicine may be used for other purposes; ask your health care provider or pharmacist if you have questions. COMMON BRAND NAME(S): Active-Medicated Specimen Kit, Delone, Diuscreen, Lasix, RX Specimen Collection Kit, Specimen Collection Kit, URINX Medicated Specimen Collection What should I tell my health care provider before I take this medicine? They need to know if you have any of these  conditions:  abnormal blood electrolytes  diarrhea or vomiting  gout  heart disease  kidney disease, small amounts of urine, or difficulty passing urine  liver disease  thyroid disease  an unusual or allergic reaction to furosemide, sulfa drugs, other medicines, foods, dyes, or preservatives  pregnant or trying to get pregnant  breast-feeding How should I use this medicine? Take this drug by mouth. Take it as directed on the prescription label at the same time every day. You can take it with or without food. If it upsets your stomach, take it with food. Keep taking it unless your health care provider tells you to stop. Talk to your health care provider about the use of this drug in children. Special care may be needed. Overdosage: If you think you have taken too much of this medicine contact a poison control center or emergency room at once. NOTE: This medicine is only for you. Do not share this medicine with others. What if I miss a dose? If you miss a dose, take it as soon as you can. If it is almost time for your next dose, take only that dose. Do not take double or extra doses. What may interact with this medicine?  aspirin and aspirin-like medicines  certain antibiotics  chloral hydrate  cisplatin  cyclosporine  digoxin  diuretics  laxatives  lithium  medicines for blood pressure  medicines that relax muscles for surgery  methotrexate  NSAIDs, medicines for pain and inflammation like ibuprofen, naproxen, or indomethacin  phenytoin  steroid medicines like prednisone or cortisone  sucralfate  thyroid hormones This list may not describe all possible interactions. Give your health care provider a list of all the medicines, herbs, non-prescription drugs, or dietary supplements you use. Also tell them if you smoke, drink alcohol, or use illegal drugs. Some items may interact with your medicine. What should I watch for while using this medicine? Visit  your doctor or health care provider for regular checks on your progress. Check your blood pressure regularly. Ask your doctor or health care provider what your blood pressure should be, and when you should contact him or her. If you are a diabetic, check your blood sugar as directed. This medicine may cause serious skin reactions. They can happen weeks to months after starting the medicine. Contact your health care provider right away if you notice fevers or flu-like symptoms with a rash. The rash may be red or purple and then turn into blisters or peeling of the skin. Or, you might notice a red rash with swelling of the face, lips or lymph nodes in your neck or under your arms. You may need to be on a special diet while taking this medicine. Check with your doctor. Also, ask how many glasses of fluid you need to drink a day. You must not get dehydrated. You may get drowsy or dizzy. Do not drive, use machinery, or do anything that needs mental alertness until you know how this drug affects you. Do not stand or sit up quickly, especially if you are an older patient. This reduces the risk of dizzy or fainting spells. Alcohol can make you more drowsy and dizzy. Avoid alcoholic drinks. This medicine can make you more sensitive to the sun. Keep out of the sun. If you cannot avoid being in the sun, wear protective clothing and use sunscreen. Do not use sun lamps or tanning beds/booths. What side effects may I notice from receiving this medicine? Side effects that you should report to your doctor or health care professional as soon as possible:  blood in urine or stools  dry mouth  fever or chills  hearing loss or ringing in the ears  irregular heartbeat  muscle pain or weakness, cramps  rash, fever, and swollen lymph nodes  redness, blistering, peeling or loosening of the skin, including inside the mouth  skin rash  stomach upset, pain, or nausea  tingling or numbness in the hands or  feet  unusually weak or tired  vomiting or diarrhea  yellowing of the eyes or skin Side effects that usually do not require medical attention (report to your doctor or health care professional if they continue or are bothersome):  headache  loss of appetite  unusual bleeding or bruising This list may not describe all possible side effects. Call your doctor for medical advice about side effects. You may report side effects to FDA at 1-800-FDA-1088. Where should I keep my medicine? Keep out of the reach of children and pets. Store at room temperature between 20 and 25 degrees C (68 and 77 degrees F). Protect from light and moisture. Keep the container tightly closed. Throw away any unused drug after the expiration date. NOTE: This sheet is a summary. It may not cover all possible  information. If you have questions about this medicine, talk to your doctor, pharmacist, or health care provider.  2020 Elsevier/Gold Standard (2019-06-13 18:01:32)

## 2020-03-08 NOTE — Progress Notes (Signed)
Cardiology Office Note:    Date:  03/08/2020   ID:  Patrick Gibbs, DOB [birth year], MRN [id number]  PCP:  Nicola Girt, DO  Cardiologist:  No primary care provider on file.  Electrophysiologist:  None   Referring MD: Nicola Girt, DO    History of Present Illness:    Patrick Gibbs is a 54 y.o. 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    Today he comes to the clinic to be evaluated for shortness of breath, bilateral leg edema, orthopnea and PND.  He tells me that he is feeling more short of breath with activity.  But was avoiding tells me that he is sleeping in a couch and he has not had a good night sleep as he wakes up in the middle the night short of breath.  He notes that sometimes he has to sleep up and laying on his side for him to get any other sleep.  He also tells me that he had an episode last weekend where he had intermittent chest pain he did call the office at which time he reported that he was started on Ranexa which he plans to pick up soon.  In addition he notes that Wednesday night he felt a few fluttering of his heart and then immediately had some chest pain which he felt shooting to his back and fell back aching but shortly felt better.  He states that he had been taking Ativan and he has been trying to be weaned off Ativan but has been difficult by his PCP.  He is concerned about the bilateral leg edema he is worried that he may be in heart failure as well as the shortness of breath.  No other complaints at this time.  Past Medical History:  Diagnosis Date  . Anterior neck pain   . Anticoagulated   . Anxiety   . Chronic fatigue   . Class 1 obesity due to excess calories with serious comorbidity and body mass index (BMI) of 30.0 to 30.9 in adult   . COPD (chronic  obstructive pulmonary disease) ([place])   . Erectile dysfunction   . History of pulmonary embolus (PE)   . Hyperlipidemia   . Hyperopia with presbyopia of both eyes   . Keratoconjunctivitis of both eyes   . Mild concentric left ventricular hypertrophy (LVH)   . Nocturnal leg cramps   . Palmar wart   . Palpitations   . Precordial chest pain   . PVC's (premature ventricular contractions)   . Rosacea   . Vitreous floater, bilateral     Past Surgical History:  Procedure Laterality Date  . CARDIAC CATHETERIZATION  12/2018  . COLONOSCOPY    . ESOPHAGOGASTRODUODENOSCOPY    . TONSILLECTOMY      Current Medications: Current Meds  Medication Sig  . LORazepam (ATIVAN) 1 MG tablet Take 0.5 mg by mouth 2 (two) times daily.  . ranolazine (RANEXA) 500 MG 12 hr tablet Take 1 tablet (500 mg total) by mouth 2 (two) times daily.  Marland Kitchen warfarin (COUMADIN) 5 MG tablet TAKE 2 TABLETS BY MOUTH DAILY AT 6 PM, EXCEPT 2 AND 1/2 TABLETS ON MONDAY, WEDNESDAY, FRIDAY AS DIRECTED     Allergies:   Ciprofloxacin, Escitalopram, and Cymbalta [duloxetine hcl]   Social History   Socioeconomic History  . Marital status:  Single    Spouse name: Not on file  . Number of children: Not on file  . Years of education: Not on file  . Highest education level: Not on file  Occupational History  . Not on file  Tobacco Use  . Smoking status: Former Smoker    Packs/day: 1.00    Years: 0.50    Pack years: 0.50    Types: Cigarettes    Quit date: 2008    Years since quitting: 13.3  . Smokeless tobacco: Never Used  Substance and Sexual Activity  . Alcohol use: Yes    Comment: rarely  . Drug use: Never  . Sexual activity: Not on file  Other Topics Concern  . Not on file  Social History Narrative  . Not on file   Social Determinants of Health   Financial Resource Strain:   . Difficulty of Paying Living Expenses:   Food Insecurity:   . Worried About Charity fundraiser in the Last Year:   . Arboriculturist in  the Last Year:   Transportation Needs:   . Film/video editor (Medical):   Marland Kitchen Lack of Transportation (Non-Medical):   Physical Activity:   . Days of Exercise per Week:   . Minutes of Exercise per Session:   Stress:   . Feeling of Stress :   Social Connections:   . Frequency of Communication with Friends and Family:   . Frequency of Social Gatherings with Friends and Family:   . Attends Religious Services:   . Active Member of Clubs or Organizations:   . Attends Archivist Meetings:   Marland Kitchen Marital Status:      Family History: The patient's family history includes CAD in his mother; Cataracts in his father, maternal grandmother, mother, paternal grandfather, and paternal grandmother; Diabetes in his father; Heart attack in his maternal uncle and maternal uncle.  ROS:   Review of Systems  Constitution: Negative for decreased appetite, fever and weight gain.  HENT: Negative for congestion, ear discharge, hoarse voice and sore throat.   Eyes: Negative for discharge, redness, vision loss in right eye and visual halos.  Cardiovascular: Reports intermittent chest pain, shortness of breath and dyspnea, orthopnea and PND.  Negative for palpitations.  Respiratory: Negative for cough, hemoptysis, shortness of breath and snoring.   Endocrine: Negative for heat intolerance and polyphagia.  Hematologic/Lymphatic: Negative for bleeding problem. Does not bruise/bleed easily.  Skin: Negative for flushing, nail changes, rash and suspicious lesions.  Musculoskeletal: Negative for arthritis, joint pain, muscle cramps, myalgias, neck pain and stiffness.  Gastrointestinal: Negative for abdominal pain, bowel incontinence, diarrhea and excessive appetite.  Genitourinary: Negative for decreased libido, genital sores and incomplete emptying.  Neurological: Negative for brief paralysis, focal weakness, headaches and loss of balance.  Psychiatric/Behavioral: Negative for altered mental status,  depression and suicidal ideas.  Allergic/Immunologic: Negative for HIV exposure and persistent infections.    EKGs/Labs/Other Studies Reviewed:    The following studies were reviewed today:   EKG: None today  A ZIO monitor was performed for 7 days beginning 08/16/2019 to assess frequent PVCs. Rhythm throughout is sinus with minimum average and maximum heart rates of 44, 68 and 142 bpm. No pauses of 3 seconds or greater and no episodes of AV node or sinus node block. Ventricular ectopy was rare with isolated PVCs and couplets.  Longest episode of bigeminy was 10.4 seconds, trigeminy 22.3 seconds. Supraventricular ectopy overall was rare.  There were 4 brief runs of  atrial premature contractions the longest 11 complexes a rate of 117 bpm consistent with atrial tachycardia.  There are no episodes of atrial fibrillation or flutter. There were 27 triggered events the majority were associated with ventricular and less frequently supraventricular premature beats.  The episodes of atrial tachycardia were not triggered. Conclusion, overall ventricular and supraventricular arrhythmias rare, however triggered events are predominantly associated with ventricular more commonly than supraventricular premature beats.  TTE Impression 08/16/2019 1. Left ventricular ejection fraction, by visual estimation, is 55 to 60%. The left ventricle has normal function. Normal left ventricular size. There is no left ventricular hypertrophy.  2. Normal Diastolic Function.  3. Global right ventricle has normal systolic function.The right ventricular size is normal. No increase in right ventricular wall thickness.  4. Left atrial size was normal.  5. Right atrial size was mildly dilated.  6. The mitral valve is normal in structure. Trace mitral valve regurgitation. No evidence of mitral stenosis.  7. The tricuspid valve is normal in structure. Tricuspid valve regurgitation is mild.  8. The aortic valve is normal  in structure. Aortic valve regurgitation  was not visualized by color flow Doppler. Structurally normal aortic valve, with no evidence of sclerosis or stenosis.  9. The pulmonic valve was normal in structure. Pulmonic valve regurgitation is not visualized by color flow Doppler.  10. Normal pulmonary artery systolic pressure.  11. Presence of pericardial fat pad.     Recent Labs: No results found for requested labs within last 8760 hours.  Recent Lipid Panel    Component Value Date/Time   CHOL 147 03/01/2020 1120   TRIG 61 03/01/2020 1120   HDL 32 (L) 03/01/2020 1120   CHOLHDL 4.6 03/01/2020 1120   LDLCALC 102 (H) 03/01/2020 1120    Physical Exam:    VS:  BP 120/84   Pulse 64   Ht '5\' 9"'  (1.753 m)   Wt 211 lb (95.7 kg)   SpO2 96%   BMI 31.16 kg/m     Wt Readings from Last 3 Encounters:  03/08/20 211 lb (95.7 kg)  03/01/20 213 lb (96.6 kg)  09/20/19 218 lb 6.4 oz (99.1 kg)     GEN: Well nourished, well developed in no acute distress HEENT: Normal NECK: No JVD; No carotid bruits LYMPHATICS: No lymphadenopathy CARDIAC: S1S2 noted,RRR, no murmurs, rubs, gallops RESPIRATORY:  Clear to auscultation without rales, wheezing or rhonchi  ABDOMEN: Soft, non-tender, non-distended, +bowel sounds, no guarding. EXTREMITIES: No edema, No cyanosis, no clubbing MUSCULOSKELETAL:  No deformity  SKIN: Warm and dry NEUROLOGIC:  Alert and oriented x 3, non-focal PSYCHIATRIC:  Normal affect, good insight  ASSESSMENT:    1. Shortness of breath   2. Atypical chest pain   3. Fatigue, unspecified type   4. Mixed hyperlipidemia    PLAN:     His shortness of breath he notes appears to be new with the bilateral leg edema.  Today in the clinic there is really no leg edema on his physical exam other than his varicose veins.  But he is very concerned that the fact that he is not sleeping flat and have to lay on the couch and sometimes sideways.  With this symptom try cautious diuretics Lasix  20 mg on Tuesday and Saturdays to see if this is going to help.  In addition since this is a significant change I reviewed his recent echocardiogram which was essentially normal but due to this drastic description of his shortness of breath we will make sure  that his heart function is still normal with repeating an echocardiogram.  For now I am going to get a BNP.  As noted earlier I am really not convinced that he is in heart failure but his symptoms is out of proportion so cautious diuretics and BMP with a repeat echocardiogram for now.  Bilateral leg edema-this could be multifactorial especially in the setting of his varicose vein I suspect venous insufficiency is contributing.  Educated patient about using support hose.  We will get an echocardiogram to reassess his LV function.  In the setting of his shortness of breath  Atypical chest pain-I did explain to the patient that his chest pain is not atypical.  He has had an heart catheterization in February 2020.  He was started on Ranexa I suspect for concern for coronary vasospasm he has not started this medication yet.  No chest pain in the last couple days.  Fatigue-we will get blood work for TSH, vitamin D as well as vitamin B12.  Blood work will also be done for BMP and mag as well as BNP.  Of note the patient tells me that he is planning to trip to [facility] may not be able to get any of his blood work or echo schedule until he gets back.  The patient is in agreement with the above plan. The patient left the office in stable condition.  The patient will follow up in as scheduled Dr. Bettina Gavia  Medication Adjustments/Labs and Tests Ordered: Current medicines are reviewed at length with the patient today.  Concerns regarding medicines are outlined above.  Orders Placed This Encounter  Procedures  . TSH  . Vitamin D (25 hydroxy)  . B12  . Basic metabolic panel  . Magnesium  . Pro b natriuretic peptide (BNP)  . EKG 12-Lead  .  ECHOCARDIOGRAM COMPLETE   Meds ordered this encounter  Medications  . furosemide (LASIX) 20 MG tablet    Sig: Take one tablet on Tuesdays and Saturdays.    Dispense:  30 tablet    Refill:  1  . potassium chloride (KLOR-CON) 10 MEQ tablet    Sig: Take one tablet on tuesdays and Saturday    Dispense:  30 tablet    Refill:  1    Patient Instructions  Medication Instructions:  Your physician has recommended you make the following change in your medication:   START: Lasix 20 mg on Tuesdays and Saturdays   START: Potassium 10 meq on Tuesdays and Saturdays    *If you need a refill on your cardiac medications before your next appointment, please call your pharmacy*   Lab Work: Your physician recommends that you return for lab work today: bmp, mg, tsh, bnp, vitamin d, vitamin b 12  If you have labs (blood work) drawn today and your tests are completely normal, you will receive your results only by: Marland Kitchen MyChart Message (if you have MyChart) OR . A paper copy in the mail If you have any lab test that is abnormal or we need to change your treatment, we will call you to review the results.   Testing/Procedures: Your physician has requested that you have an echocardiogram. Echocardiography is a painless test that uses sound waves to create images of your heart. It provides your doctor with information about the size and shape of your heart and how well your heart's chambers and valves are working. This procedure takes approximately one hour. There are no restrictions for this procedure.  Follow-Up: At [facility], you and your health needs are our priority.  As part of our continuing mission to provide you with exceptional heart care, we have created designated Provider Care Teams.  These Care Teams include your primary Cardiologist (physician) and Advanced Practice Providers (APPs -  Physician Assistants and Nurse Practitioners) who all work together to provide you with the care you  need, when you need it.  We recommend signing up for the patient portal called "MyChart".  Sign up information is provided on this After Visit Summary.  MyChart is used to connect with patients for Virtual Visits (Telemedicine).  Patients are able to view lab/test results, encounter notes, upcoming appointments, etc.  Non-urgent messages can be sent to your provider as well.   To learn more about what you can do with MyChart, go to [email or web address].    Your next appointment:   Follow up with Dr. Bettina Gavia    Other Instructions   Echocardiogram An echocardiogram is a procedure that uses painless sound waves (ultrasound) to produce an image of the heart. Images from an echocardiogram can provide important information about:  Signs of coronary artery disease (CAD).  Aneurysm detection. An aneurysm is a weak or damaged part of an artery wall that bulges out from the normal force of blood pumping through the body.  Heart size and shape. Changes in the size or shape of the heart can be associated with certain conditions, including heart failure, aneurysm, and CAD.  Heart muscle function.  Heart valve function.  Signs of a past heart attack.  Fluid buildup around the heart.  Thickening of the heart muscle.  A tumor or infectious growth around the heart valves. Tell a health care provider about:  Any allergies you have.  All medicines you are taking, including vitamins, herbs, eye drops, creams, and over-the-counter medicines.  Any blood disorders you have.  Any surgeries you have had.  Any medical conditions you have.  Whether you are pregnant or may be pregnant. What are the risks? Generally, this is a safe procedure. However, problems may occur, including:  Allergic reaction to dye (contrast) that may be used during the procedure. What happens before the procedure? No specific preparation is needed. You may eat and drink normally. What happens during the  procedure?   An IV tube may be inserted into one of your veins.  You may receive contrast through this tube. A contrast is an injection that improves the quality of the pictures from your heart.  A gel will be applied to your chest.  A wand-like tool (transducer) will be moved over your chest. The gel will help to transmit the sound waves from the transducer.  The sound waves will harmlessly bounce off of your heart to allow the heart images to be captured in real-time motion. The images will be recorded on a computer. The procedure may vary among health care providers and hospitals. What happens after the procedure?  You may return to your normal, everyday life, including diet, activities, and medicines, unless your health care provider tells you not to do that. Summary  An echocardiogram is a procedure that uses painless sound waves (ultrasound) to produce an image of the heart.  Images from an echocardiogram can provide important information about the size and shape of your heart, heart muscle function, heart valve function, and fluid buildup around your heart.  You do not need to do anything to prepare before this procedure. You may eat and drink normally.  After the echocardiogram is completed, you may return to your normal, everyday life, unless your health care provider tells you not to do that. This information is not intended to replace advice given to you by your health care provider. Make sure you discuss any questions you have with your health care provider. Document Revised: 02/16/2019 Document Reviewed: 11/28/2016 Elsevier Patient Education  [place].  Furosemide Oral Tablets What is this medicine? FUROSEMIDE (fyoor [place] se mide) is a diuretic. It helps you make more urine and to lose salt and excess water from your body. It treats swelling from heart, kidney, or liver disease. It also treats high blood pressure. This medicine may be used for other purposes; ask  your health care provider or pharmacist if you have questions. COMMON BRAND NAME(S): Active-Medicated Specimen Kit, Delone, Diuscreen, Lasix, RX Specimen Collection Kit, Specimen Collection Kit, URINX Medicated Specimen Collection What should I tell my health care provider before I take this medicine? They need to know if you have any of these conditions:  abnormal blood electrolytes  diarrhea or vomiting  gout  heart disease  kidney disease, small amounts of urine, or difficulty passing urine  liver disease  thyroid disease  an unusual or allergic reaction to furosemide, sulfa drugs, other medicines, foods, dyes, or preservatives  pregnant or trying to get pregnant  breast-feeding How should I use this medicine? Take this drug by mouth. Take it as directed on the prescription label at the same time every day. You can take it with or without food. If it upsets your stomach, take it with food. Keep taking it unless your health care provider tells you to stop. Talk to your health care provider about the use of this drug in children. Special care may be needed. Overdosage: If you think you have taken too much of this medicine contact a poison control center or emergency room at once. NOTE: This medicine is only for you. Do not share this medicine with others. What if I miss a dose? If you miss a dose, take it as soon as you can. If it is almost time for your next dose, take only that dose. Do not take double or extra doses. What may interact with this medicine?  aspirin and aspirin-like medicines  certain antibiotics  chloral hydrate  cisplatin  cyclosporine  digoxin  diuretics  laxatives  lithium  medicines for blood pressure  medicines that relax muscles for surgery  methotrexate  NSAIDs, medicines for pain and inflammation like ibuprofen, naproxen, or indomethacin  phenytoin  steroid medicines like prednisone or cortisone  sucralfate  thyroid  hormones This list may not describe all possible interactions. Give your health care provider a list of all the medicines, herbs, non-prescription drugs, or dietary supplements you use. Also tell them if you smoke, drink alcohol, or use illegal drugs. Some items may interact with your medicine. What should I watch for while using this medicine? Visit your doctor or health care provider for regular checks on your progress. Check your blood pressure regularly. Ask your doctor or health care provider what your blood pressure should be, and when you should contact him or her. If you are a diabetic, check your blood sugar as directed. This medicine may cause serious skin reactions. They can happen weeks to months after starting the medicine. Contact your health care provider right away if you notice fevers or flu-like symptoms with a rash. The rash may be red or purple and then turn into blisters  or peeling of the skin. Or, you might notice a red rash with swelling of the face, lips or lymph nodes in your neck or under your arms. You may need to be on a special diet while taking this medicine. Check with your doctor. Also, ask how many glasses of fluid you need to drink a day. You must not get dehydrated. You may get drowsy or dizzy. Do not drive, use machinery, or do anything that needs mental alertness until you know how this drug affects you. Do not stand or sit up quickly, especially if you are an older patient. This reduces the risk of dizzy or fainting spells. Alcohol can make you more drowsy and dizzy. Avoid alcoholic drinks. This medicine can make you more sensitive to the sun. Keep out of the sun. If you cannot avoid being in the sun, wear protective clothing and use sunscreen. Do not use sun lamps or tanning beds/booths. What side effects may I notice from receiving this medicine? Side effects that you should report to your doctor or health care professional as soon as possible:  blood in urine or  stools  dry mouth  fever or chills  hearing loss or ringing in the ears  irregular heartbeat  muscle pain or weakness, cramps  rash, fever, and swollen lymph nodes  redness, blistering, peeling or loosening of the skin, including inside the mouth  skin rash  stomach upset, pain, or nausea  tingling or numbness in the hands or feet  unusually weak or tired  vomiting or diarrhea  yellowing of the eyes or skin Side effects that usually do not require medical attention (report to your doctor or health care professional if they continue or are bothersome):  headache  loss of appetite  unusual bleeding or bruising This list may not describe all possible side effects. Call your doctor for medical advice about side effects. You may report side effects to FDA at 1-800-FDA-1088. Where should I keep my medicine? Keep out of the reach of children and pets. Store at room temperature between 20 and 25 degrees C (68 and 77 degrees F). Protect from light and moisture. Keep the container tightly closed. Throw away any unused drug after the expiration date. NOTE: This sheet is a summary. It may not cover all possible information. If you have questions about this medicine, talk to your doctor, pharmacist, or health care provider.  2020 Elsevier/Gold Standard (2019-06-13 18:01:32)      Adopting a Healthy Lifestyle.  Know what a healthy weight is for you (roughly BMI <25) and aim to maintain this   Aim for 7+ servings of fruits and vegetables daily   65-80+ fluid ounces of water or unsweet tea for healthy kidneys   Limit to max 1 drink of alcohol per day; avoid smoking/tobacco   Limit animal fats in diet for cholesterol and heart health - choose grass fed whenever available   Avoid highly processed foods, and foods high in saturated/trans fats   Aim for low stress - take time to unwind and care for your mental health   Aim for 150 min of moderate intensity exercise weekly for  heart health, and weights twice weekly for bone health   Aim for 7-9 hours of sleep daily   When it comes to diets, agreement about the perfect plan isnt easy to find, even among the experts. Experts at the [employer] developed an idea known as the Healthy Eating Plate. Just imagine a plate divided  into logical, healthy portions.   The emphasis is on diet quality:   Load up on vegetables and fruits - one-half of your plate: Aim for color and variety, and remember that potatoes dont count.   Go for whole grains - one-quarter of your plate: Whole wheat, barley, wheat berries, quinoa, oats, brown rice, and foods made with them. If you want pasta, go with whole wheat pasta.   Protein power - one-quarter of your plate: Fish, chicken, beans, and nuts are all healthy, versatile protein sources. Limit red meat.   The diet, however, does go beyond the plate, offering a few other suggestions.   Use healthy plant oils, such as olive, canola, soy, corn, sunflower and peanut. Check the labels, and avoid partially hydrogenated oil, which have unhealthy trans fats.   If youre thirsty, drink water. Coffee and tea are good in moderation, but skip sugary drinks and limit milk and dairy products to one or two daily servings.   The type of carbohydrate in the diet is more important than the amount. Some sources of carbohydrates, such as vegetables, fruits, whole grains, and beans-are healthier than others.   Finally, stay active  Signed, Berniece Salines, DO  03/08/2020 12:24 PM    [facility]

## 2020-03-09 LAB — BASIC METABOLIC PANEL
BUN/Creatinine Ratio: 14 (ref 9–20)
BUN: 15 mg/dL (ref 6–24)
CO2: 25 mmol/L (ref 20–29)
Calcium: 8.8 mg/dL (ref 8.7–10.2)
Chloride: 102 mmol/L (ref 96–106)
Creatinine, Ser: 1.07 mg/dL (ref 0.76–1.27)
GFR calc Af Amer: 90 mL/min/{1.73_m2} (ref >59)
GFR calc non Af Amer: 78 mL/min/{1.73_m2} (ref >59)
Glucose: 91 mg/dL (ref 65–99)
Potassium: 4.2 mmol/L (ref 3.5–5.2)
Sodium: 139 mmol/L (ref 134–144)

## 2020-03-09 LAB — VITAMIN B12: Vitamin B-12: 432 pg/mL (ref 232–1245)

## 2020-03-09 LAB — TSH: TSH: 1.21 u[IU]/mL (ref 0.450–4.500)

## 2020-03-09 LAB — VITAMIN D 25 HYDROXY (VIT D DEFICIENCY, FRACTURES): Vit D, 25-Hydroxy: 35.4 ng/mL (ref 30.0–100.0)

## 2020-03-09 LAB — PRO B NATRIURETIC PEPTIDE: NT-Pro BNP: 67 pg/mL (ref 0–121)

## 2020-03-09 LAB — MAGNESIUM: Magnesium: 2.3 mg/dL (ref 1.6–2.3)

## 2020-03-11 ENCOUNTER — Telehealth: Payer: Self-pay

## 2020-03-11 NOTE — Telephone Encounter (Signed)
Spoke with patient regarding results.  Patient verbalizes understanding and is agreeable to plan of care. Advised patient to call back with any issues or concerns.  

## 2020-03-11 NOTE — Telephone Encounter (Signed)
-----   Message from Thomasene Ripple, DO sent at 03/11/2020 12:49 PM EDT ----- Labs normal

## 2020-03-15 ENCOUNTER — Other Ambulatory Visit (HOSPITAL_BASED_OUTPATIENT_CLINIC_OR_DEPARTMENT_OTHER): Payer: Managed Care, Other (non HMO)

## 2020-03-18 ENCOUNTER — Other Ambulatory Visit: Payer: Self-pay

## 2020-03-18 ENCOUNTER — Ambulatory Visit (HOSPITAL_BASED_OUTPATIENT_CLINIC_OR_DEPARTMENT_OTHER)
Admission: RE | Admit: 2020-03-18 | Discharge: 2020-03-18 | Disposition: A | Discharge status: Home / Self Care | Payer: Managed Care, Other (non HMO) | Source: Ambulatory Visit | Attending: Cardiology | Admitting: Cardiology

## 2020-03-18 DIAGNOSIS — R0602 Shortness of breath: Secondary | ICD-10-CM | POA: Insufficient documentation

## 2020-03-18 DIAGNOSIS — R0789 Other chest pain: Secondary | ICD-10-CM | POA: Insufficient documentation

## 2020-03-18 NOTE — Progress Notes (Signed)
  Echocardiogram 2D Echocardiogram has been performed.  Sinda Du 03/18/2020, 3:40 PM

## 2020-03-20 ENCOUNTER — Telehealth: Payer: Self-pay

## 2020-03-20 NOTE — Telephone Encounter (Signed)
-----   Message from Thomasene Ripple, DO sent at 03/20/2020  2:10 PM EDT ----- Your heart wall is thickened moderately but otherwise normal echocardiogram.  H

## 2020-03-20 NOTE — Telephone Encounter (Signed)
Spoke with patient regarding results.  Patient verbalizes understanding and is agreeable to plan of care. Advised patient to call back with any issues or concerns.  

## 2020-04-01 NOTE — Progress Notes (Addendum)
Cardiology Office Note:    Date:  04/02/2020   ID:  Patrick Gibbs, DOB Oct 11, 1966, MRN [id number]  PCP:  Leola Brazil, DO  Cardiologist:  Norman Herrlich, MD    Referring MD: Leola Brazil, DO    ASSESSMENT:    1. Atypical chest pain   2. Bilateral pulmonary embolism (HCC)   3. Stage 1 mild COPD by GOLD classification (HCC)    PLAN:    In order of problems listed above:  1. Further evaluation cardiac CTA negative calcium score will be therapy as any coronary atherosclerosis.  I suspect his chest pain is noncardiac nonanginal in note he does have thoracic scoliosis. 2. We will check a iPhone based That he will purchase the record oxygen saturation heart rate and blood pressure bring to his next visit   Next appointment: 6 weeks   Medication Adjustments/Labs and Tests Ordered: Current medicines are reviewed at length with the patient today.  Concerns regarding medicines are outlined above.  No orders of the defined types were placed in this encounter.  No orders of the defined types were placed in this encounter.   Chief Complaint  Patient presents with  . Follow-up    History of Present Illness:    Patrick Gibbs is a 54 y.o. male with a hx of  recurrent chest pain with left heart catheterization in February 2020 showed no significant CAD noted, chronic fatigue, anxiety, bilateral pulmonary embolism on warfarin which is managed by the clinic in [facility].    He was last seen 03/08/2020. Compliance with diet, lifestyle and medications: Yes  Is a tough case he continues to have nonexertional chest pain. He is convinced that he has heart disease has had normal coronary angiography 2016 2020 asked me about microvascular CAD we will set up a cardiac CTA to look for evidence of atherosclerosis or progressive CAD.  He tells me that he stopped breathing at nighttime we talked about a sleep test he had a few years ago He prefers to purchase follow-up but will require  saturation record heart rate and blood pressure to his iPhone bring him to the office.  He does continue ranolazine no benefit   Ref Range & Units 3 wk ago  NT-Pro BNP 0 - 121 pg/mL 67    Echo 03/18/2020: 1. Left ventricular ejection fraction, by estimation, is 60 to 65%. The  left ventricle has normal function. The left ventricle has no regional  wall motion abnormalities. There is mild left ventricular hypertrophy.  Left ventricular diastolic parameters  were normal.  2. Right ventricular systolic function is normal. The right ventricular  size is normal. There is mildly elevated pulmonary artery systolic  pressure.  3. The mitral valve is normal in structure. No evidence of mitral valve  regurgitation. No evidence of mitral stenosis.  4. The aortic valve is normal in structure. Aortic valve regurgitation is  not visualized. No aortic stenosis is present.  5. The inferior vena cava is normal in size with greater than 50%  respiratory variability, suggesting right atrial pressure of 3 mmHg.   Prior to leaving the office he called me back and asked me about the echocardiogram my partner had looked-interpretation describes his left ventricle is normal diameter normal function with moderate hypertrophy I think the degree is best described as borderline entitlements unchanged from his previous. Past Medical History:  Diagnosis Date  . Anterior neck pain   . Anticoagulated   . Anxiety   . Chronic fatigue   .  Class 1 obesity due to excess calories with serious comorbidity and body mass index (BMI) of 30.0 to 30.9 in adult   . COPD (chronic obstructive pulmonary disease) ([place])   . Erectile dysfunction   . History of pulmonary embolus (PE)   . Hyperlipidemia   . Hyperopia with presbyopia of both eyes   . Keratoconjunctivitis of both eyes   . Mild concentric left ventricular hypertrophy (LVH)   . Nocturnal leg cramps   . Palmar wart   . Palpitations   . Precordial chest pain   .  PVC's (premature ventricular contractions)   . Rosacea   . Vitreous floater, bilateral     Past Surgical History:  Procedure Laterality Date  . CARDIAC CATHETERIZATION  12/2018  . COLONOSCOPY    . ESOPHAGOGASTRODUODENOSCOPY    . TONSILLECTOMY      Current Medications: Current Meds  Medication Sig  . LORazepam (ATIVAN) 1 MG tablet Take 0.5 mg by mouth 2 (two) times daily.  . Tiotropium Bromide Monohydrate (SPIRIVA RESPIMAT IN) Inhale 2 puffs into the lungs daily as needed.  . warfarin (COUMADIN) 5 MG tablet TAKE 2 TABLETS BY MOUTH DAILY AT 6 PM, EXCEPT 2 AND 1/2 TABLETS ON MONDAY, WEDNESDAY, FRIDAY AS DIRECTED     Allergies:   Ciprofloxacin, Escitalopram, and Cymbalta [duloxetine hcl]   Social History   Socioeconomic History  . Marital status: Single    Spouse name: Not on file  . Number of children: Not on file  . Years of education: Not on file  . Highest education level: Not on file  Occupational History  . Not on file  Tobacco Use  . Smoking status: Former Smoker    Packs/day: 1.00    Years: 0.50    Pack years: 0.50    Types: Cigarettes    Quit date: 2008    Years since quitting: 13.4  . Smokeless tobacco: Never Used  Substance and Sexual Activity  . Alcohol use: Yes    Comment: rarely  . Drug use: Never  . Sexual activity: Not on file  Other Topics Concern  . Not on file  Social History Narrative  . Not on file   Social Determinants of Health   Financial Resource Strain:   . Difficulty of Paying Living Expenses:   Food Insecurity:   . Worried About Charity fundraiser in the Last Year:   . Arboriculturist in the Last Year:   Transportation Needs:   . Film/video editor (Medical):   Marland Kitchen Lack of Transportation (Non-Medical):   Physical Activity:   . Days of Exercise per Week:   . Minutes of Exercise per Session:   Stress:   . Feeling of Stress :   Social Connections:   . Frequency of Communication with Friends and Family:   . Frequency of  Social Gatherings with Friends and Family:   . Attends Religious Services:   . Active Member of Clubs or Organizations:   . Attends Archivist Meetings:   Marland Kitchen Marital Status:      Family History: The patient's family history includes CAD in his mother; Cataracts in his father, maternal grandmother, mother, paternal grandfather, and paternal grandmother; Diabetes in his father; Heart attack in his maternal uncle and maternal uncle. ROS:   Please see the history of present illness.    All other systems reviewed and are negative.  EKGs/Labs/Other Studies Reviewed:    The following studies were reviewed today:    Recent  Labs: 03/08/2020: BUN 15; Creatinine, Ser 1.07; Magnesium 2.3; NT-Pro BNP 67; Potassium 4.2; Sodium 139; TSH 1.210  Recent Lipid Panel    Component Value Date/Time   CHOL 147 03/01/2020 1120   TRIG 61 03/01/2020 1120   HDL 32 (L) 03/01/2020 1120   CHOLHDL 4.6 03/01/2020 1120   LDLCALC 102 (H) 03/01/2020 1120    Physical Exam:    VS:  BP 118/72   Pulse 68   Ht 5' 9.5" (1.765 m)   Wt 216 lb (98 kg)   SpO2 97%   BMI 31.44 kg/m     Wt Readings from Last 3 Encounters:  04/02/20 216 lb (98 kg)  03/08/20 211 lb (95.7 kg)  03/01/20 213 lb (96.6 kg)     GEN:  Well nourished, well developed in no acute distress HEENT: Normal NECK: No JVD; No carotid bruits LYMPHATICS: No lymphadenopathy CARDIAC: RRR, no murmurs, rubs, gallops RESPIRATORY:  Clear to auscultation without rales, wheezing or rhonchi  ABDOMEN: Soft, non-tender, non-distended MUSCULOSKELETAL:  No edema; No deformity  SKIN: Warm and dry NEUROLOGIC:  Alert and oriented x 3 PSYCHIATRIC:  Normal affect    Signed, Norman Herrlich, MD  04/02/2020 4:33 PM    [facility]

## 2020-04-02 ENCOUNTER — Encounter: Payer: Self-pay | Admitting: Cardiology

## 2020-04-02 ENCOUNTER — Ambulatory Visit: Payer: Managed Care, Other (non HMO) | Admitting: Cardiology

## 2020-04-02 ENCOUNTER — Other Ambulatory Visit: Payer: Self-pay

## 2020-04-02 ENCOUNTER — Ambulatory Visit (INDEPENDENT_AMBULATORY_CARE_PROVIDER_SITE_OTHER): Payer: Managed Care, Other (non HMO) | Admitting: Cardiology

## 2020-04-02 VITALS — BP 118/72 | HR 68 | Ht 69.5 in | Wt 216.0 lb

## 2020-04-02 DIAGNOSIS — R0789 Other chest pain: Secondary | ICD-10-CM | POA: Diagnosis not present

## 2020-04-02 DIAGNOSIS — J449 Chronic obstructive pulmonary disease, unspecified: Secondary | ICD-10-CM

## 2020-04-02 DIAGNOSIS — I2699 Other pulmonary embolism without acute cor pulmonale: Secondary | ICD-10-CM | POA: Diagnosis not present

## 2020-04-02 MED ORDER — METOPROLOL TARTRATE 100 MG PO TABS
100.0000 mg | ORAL_TABLET | Freq: Once | ORAL | 0 refills | Status: DC
Start: 2020-04-02 — End: 2020-05-24

## 2020-04-02 NOTE — Patient Instructions (Addendum)
Medication Instructions:  Your physician recommends that you continue on your current medications as directed. Please refer to the Current Medication list given to you today.  *If you need a refill on your cardiac medications before your next appointment, please call your pharmacy*   Lab Work: Your physician recommends that you return for lab work in: TODAY BMP If you have labs (blood work) drawn today and your tests are completely normal, you will receive your results only by: Marland Kitchen MyChart Message (if you have MyChart) OR . A paper copy in the mail If you have any lab test that is abnormal or we need to change your treatment, we will call you to review the results.   Testing/Procedures: Your cardiac CT will be scheduled at the below location:   [facility] [address] [place], [place] [address] [phone]  If scheduled at [facility], please arrive at the [facility] main entrance of [facility] 30 minutes prior to test start time. Proceed to the [facility] Radiology Department (first floor) to check-in and test prep.   Please follow these instructions carefully (unless otherwise directed):   On the Night Before the Test: . Be sure to Drink plenty of water. . Do not consume any caffeinated/decaffeinated beverages or chocolate 12 hours prior to your test. . Do not take any antihistamines 12 hours prior to your test.  On the Day of the Test: . Drink plenty of water. Do not drink any water within one hour of the test. . Do not eat any food 4 hours prior to the test. . You may take your regular medications prior to the test.  . Take metoprolol (Lopressor) two hours prior to test.         After the Test: . Drink plenty of water. . After receiving IV contrast, you may experience a mild flushed feeling. This is normal. . On occasion, you may experience a mild rash up to 24 hours after the test. This is not dangerous. If this occurs, you can  take Benadryl 25 mg and increase your fluid intake. . If you experience trouble breathing, this can be serious. If it is severe call 911 IMMEDIATELY. If it is mild, please call our office. . If you take any of these medications: Glipizide/Metformin, Avandament, Glucavance, please do not take 48 hours after completing test unless otherwise instructed.   Once we have confirmed authorization from your insurance company, we will call you to set up a date and time for your test.   For non-scheduling related questions, please contact the cardiac imaging nurse navigator should you have any questions/concerns: Marchia Bond, Cardiac Imaging Nurse Navigator Burley Saver, Interim Cardiac Imaging Nurse [facility] Direct Office Dial: [phone]   For scheduling needs, including cancellations and rescheduling, please call [phone].      Follow-Up: At [facility], you and your health needs are our priority.  As part of our continuing mission to provide you with exceptional heart care, we have created designated Provider Care Teams.  These Care Teams include your primary Cardiologist (physician) and Advanced Practice Providers (APPs -  Physician Assistants and Nurse Practitioners) who all work together to provide you with the care you need, when you need it.  We recommend signing up for the patient portal called "MyChart".  Sign up information is provided on this After Visit Summary.  MyChart is used to connect with patients for Virtual Visits (Telemedicine).  Patients are  able to view lab/test results, encounter notes, upcoming appointments, etc.  Non-urgent messages can be sent to your provider as well.   To learn more about what you can do with MyChart, go to [email or web address].    Your next appointment:   6 week(s)  The format for your next appointment:   In Person  Provider:   Shirlee More, MD   Other Instructions

## 2020-04-03 LAB — BASIC METABOLIC PANEL
BUN/Creatinine Ratio: 11 (ref 9–20)
BUN: 14 mg/dL (ref 6–24)
CO2: 21 mmol/L (ref 20–29)
Calcium: 8.9 mg/dL (ref 8.7–10.2)
Chloride: 103 mmol/L (ref 96–106)
Creatinine, Ser: 1.32 mg/dL — ABNORMAL HIGH (ref 0.76–1.27)
GFR calc Af Amer: 70 mL/min/{1.73_m2} (ref >59)
GFR calc non Af Amer: 61 mL/min/{1.73_m2} (ref >59)
Glucose: 98 mg/dL (ref 65–99)
Potassium: 4.1 mmol/L (ref 3.5–5.2)
Sodium: 138 mmol/L (ref 134–144)

## 2020-04-24 ENCOUNTER — Telehealth: Payer: Self-pay | Admitting: Cardiology

## 2020-04-24 DIAGNOSIS — R0789 Other chest pain: Secondary | ICD-10-CM

## 2020-04-24 NOTE — Telephone Encounter (Signed)
Spoke to the patient just now and let him know that someone would call him to schedule this cardiac CT. He is aware that it may take several weeks to get this scheduled. I also advised him that if he has chest pain that persists in the meantime that he should proceed to the ED to be evaluated.    Encouraged patient to call back with any questions or concerns.

## 2020-04-24 NOTE — Telephone Encounter (Signed)
Patrick Gibbs is calling requesting a nurse call him to inform him when his CT is scheduled. Please advise.

## 2020-05-06 ENCOUNTER — Telehealth: Payer: Self-pay | Admitting: Cardiology

## 2020-05-06 ENCOUNTER — Other Ambulatory Visit: Payer: Self-pay | Admitting: Cardiology

## 2020-05-06 NOTE — Telephone Encounter (Signed)
Spoke to the patient just now. He asked if this cardiac CT would be looking at his entire body or if it would just be focusing on his heart. I let him know that this would be focusing on his heart and the arteries around his heart. He verbalizes understanding of this and thanks me for the call back.    Encouraged patient to call back with any questions or concerns.

## 2020-05-06 NOTE — Telephone Encounter (Signed)
New message    Patient would like a nurse to call him about the Cardiac ct that he has scheduled for July 1,2021 , he has clinical questions to go over before having test

## 2020-05-09 ENCOUNTER — Ambulatory Visit (HOSPITAL_COMMUNITY): Admission: RE | Admit: 2020-05-09 | Payer: Managed Care, Other (non HMO) | Source: Ambulatory Visit

## 2020-05-10 ENCOUNTER — Ambulatory Visit: Payer: Managed Care, Other (non HMO) | Admitting: Cardiology

## 2020-05-14 ENCOUNTER — Other Ambulatory Visit (HOSPITAL_COMMUNITY): Payer: Managed Care, Other (non HMO)

## 2020-05-15 ENCOUNTER — Telehealth (HOSPITAL_COMMUNITY): Payer: Self-pay | Admitting: *Deleted

## 2020-05-15 NOTE — Telephone Encounter (Signed)

## 2020-05-15 NOTE — Telephone Encounter (Signed)
Attempted to call patient regarding upcoming cardiac CT appointment. °Pt's mailbox is full and unable to leave a message.  Will make another attempt to reach pt. ° °Merle Tai RN Navigator Cardiac Imaging °[facility] °[phone] Office °[phone] Cell ° °

## 2020-05-16 ENCOUNTER — Other Ambulatory Visit: Payer: Self-pay

## 2020-05-16 ENCOUNTER — Ambulatory Visit (HOSPITAL_COMMUNITY)
Admission: RE | Admit: 2020-05-16 | Discharge: 2020-05-16 | Disposition: A | Discharge status: Home / Self Care | Payer: Managed Care, Other (non HMO) | Source: Ambulatory Visit | Attending: Cardiology | Admitting: Cardiology

## 2020-05-16 DIAGNOSIS — R0789 Other chest pain: Secondary | ICD-10-CM | POA: Diagnosis present

## 2020-05-16 DIAGNOSIS — R079 Chest pain, unspecified: Secondary | ICD-10-CM | POA: Diagnosis not present

## 2020-05-16 MED ORDER — NITROGLYCERIN 0.4 MG SL SUBL
0.8000 mg | SUBLINGUAL_TABLET | SUBLINGUAL | Status: DC | PRN
  Administered 2020-05-16: 0.8 mg via SUBLINGUAL

## 2020-05-16 MED ORDER — NITROGLYCERIN 0.4 MG SL SUBL
SUBLINGUAL_TABLET | SUBLINGUAL | Status: AC
  Filled 2020-05-16: qty 2

## 2020-05-16 MED ORDER — IOHEXOL 350 MG/ML SOLN
80.0000 mL | Freq: Once | INTRAVENOUS | Status: AC | PRN
  Administered 2020-05-16: 80 mL via INTRAVENOUS

## 2020-05-17 ENCOUNTER — Telehealth: Payer: Self-pay

## 2020-05-17 NOTE — Telephone Encounter (Signed)
Spoke with patient regarding results and recommendation.  Patient verbalizes understanding and is agreeable to plan of care. Advised patient to call back with any issues or concerns.  

## 2020-05-23 NOTE — Progress Notes (Signed)
Cardiology Office Note:    Date:  05/24/2020   ID:  Patrick Gibbs, DOB 10/21/1966, MRN [id number]  PCP:  Leola Brazil, DO  Cardiologist:  Norman Herrlich, MD    Referring MD: Leola Brazil, DO    ASSESSMENT:    1. Atypical chest pain   2. Chronic anticoagulation    PLAN:    In order of problems listed above:  1. I reinforced the reassurance of his cardiac testing he has had a great deal of technology no indication of underlying cardiomyopathy at this time I do no further diagnostic cardiac testing 2. Will continue warfarin managed through his PCP 3. At his request laboratory test for Lyme's serology and a sedimentation rate and CRP to screen for inflammatory disorder or vasculitis   Next appointment: 1 year   Medication Adjustments/Labs and Tests Ordered: Current medicines are reviewed at length with the patient today.  Concerns regarding medicines are outlined above.  No orders of the defined types were placed in this encounter.  No orders of the defined types were placed in this encounter.   No chief complaint on file.   History of Present Illness:    Patrick Gibbs is a 54 y.o. male with a hx of recurrent chest pain with left heart catheterization in February 2020 showed no significant CAD noted, chronic fatigue, anxiety, bilateral pulmonary embolism on warfarin which is managed by the clinic in [facility] last seen 04/02/2020.  Although has had normal coronary angiography in the past last visit was concerned of microvascular coronary disease underwent cardiac CTA.  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 Compliance with diet, lifestyle and medications: Yes  I reviewed his cardiac CTA with him near normal with a calcium score of 1 and minimal atherosclerosis in a single site mid left anterior descending.  I also reviewed with him his cardiac MRI that was normal.  His  symptoms are nonanginal I do not think he requires any further cardiac diagnostic testing.  He is concerned that he may have consequences of Lyme's disease with tick bite request labs and also seems to be concerned about vasculitis.  I told him that a CRP and sed rate if normal in my opinion in this clinical circumstance would exclude the diagnosis.  He is not having typical angina dyspnea. Past Medical History:  Diagnosis Date  . Anterior neck pain   . Anticoagulated   . Anxiety   . Chronic fatigue   . Class 1 obesity due to excess calories with serious comorbidity and body mass index (BMI) of 30.0 to 30.9 in adult   . COPD (chronic obstructive pulmonary disease) (HCC)   . Erectile dysfunction   . History of pulmonary embolus (PE)   . Hyperlipidemia   . Hyperopia with presbyopia of both eyes   . Keratoconjunctivitis of both eyes   . Mild concentric left ventricular hypertrophy (LVH)   . Nocturnal leg cramps   . Palmar wart   . Palpitations   . Precordial chest pain   . PVC's (premature ventricular contractions)   . Rosacea   . Vitreous floater, bilateral     Past Surgical History:  Procedure Laterality Date  . CARDIAC CATHETERIZATION  12/2018  . COLONOSCOPY    . ESOPHAGOGASTRODUODENOSCOPY    . TONSILLECTOMY      Current Medications: Current Meds  Medication Sig  . LORazepam (ATIVAN) 1 MG tablet Take  0.5 mg by mouth 2 (two) times daily.  . Tiotropium Bromide Monohydrate (SPIRIVA RESPIMAT IN) Inhale 2 puffs into the lungs daily as needed.  . warfarin (COUMADIN) 5 MG tablet TAKE 2 TABLETS BY MOUTH DAILY AT 6 PM, EXCEPT 2 AND 1/2 TABLETS ON MONDAY, WEDNESDAY, FRIDAY AS DIRECTED     Allergies:   Ciprofloxacin, Escitalopram, and Cymbalta [duloxetine hcl]   Social History   Socioeconomic History  . Marital status: Single    Spouse name: Not on file  . Number of children: Not on file  . Years of education: Not on file  . Highest education level: Not on file  Occupational  History  . Not on file  Tobacco Use  . Smoking status: Former Smoker    Packs/day: 1.00    Years: 0.50    Pack years: 0.50    Types: Cigarettes    Quit date: 2008    Years since quitting: 13.5  . Smokeless tobacco: Never Used  Vaping Use  . Vaping Use: Never used  Substance and Sexual Activity  . Alcohol use: Yes    Comment: rarely  . Drug use: Never  . Sexual activity: Not on file  Other Topics Concern  . Not on file  Social History Narrative  . Not on file   Social Determinants of Health   Financial Resource Strain:   . Difficulty of Paying Living Expenses:   Food Insecurity:   . Worried About Programme researcher, broadcasting/film/video in the Last Year:   . Barista in the Last Year:   Transportation Needs:   . Freight forwarder (Medical):   Marland Kitchen Lack of Transportation (Non-Medical):   Physical Activity:   . Days of Exercise per Week:   . Minutes of Exercise per Session:   Stress:   . Feeling of Stress :   Social Connections:   . Frequency of Communication with Friends and Family:   . Frequency of Social Gatherings with Friends and Family:   . Attends Religious Services:   . Active Member of Clubs or Organizations:   . Attends Banker Meetings:   Marland Kitchen Marital Status:      Family History: The patient's family history includes CAD in his mother; Cataracts in his father, maternal grandmother, mother, paternal grandfather, and paternal grandmother; Diabetes in his father; Heart attack in his maternal uncle and maternal uncle. ROS:   Please see the history of present illness.    All other systems reviewed and are negative.  EKGs/Labs/Other Studies Reviewed:    The following studies were reviewed today:    Recent Labs: 03/08/2020: Magnesium 2.3; NT-Pro BNP 67; TSH 1.210 04/02/2020: BUN 14; Creatinine, Ser 1.32; Potassium 4.1; Sodium 138  Recent Lipid Panel    Component Value Date/Time   CHOL 147 03/01/2020 1120   TRIG 61 03/01/2020 1120   HDL 32 (L) 03/01/2020  1120   CHOLHDL 4.6 03/01/2020 1120   LDLCALC 102 (H) 03/01/2020 1120    Physical Exam:    VS:  BP 102/72   Pulse 63   Ht 5' 9.5" (1.765 m)   Wt 212 lb (96.2 kg)   SpO2 96%   BMI 30.86 kg/m     Wt Readings from Last 3 Encounters:  05/24/20 212 lb (96.2 kg)  04/02/20 216 lb (98 kg)  03/08/20 211 lb (95.7 kg)     GEN:  Well nourished, well developed in no acute distress HEENT: Normal NECK: No JVD; No carotid bruits LYMPHATICS:  No lymphadenopathy CARDIAC: RRR, no murmurs, rubs, gallops RESPIRATORY:  Clear to auscultation without rales, wheezing or rhonchi  ABDOMEN: Soft, non-tender, non-distended MUSCULOSKELETAL:  No edema; No deformity  SKIN: Warm and dry NEUROLOGIC:  Alert and oriented x 3 PSYCHIATRIC:  Normal affect    Signed, Norman Herrlich, MD  05/24/2020 9:04 AM    [facility]

## 2020-05-24 ENCOUNTER — Ambulatory Visit: Payer: Managed Care, Other (non HMO) | Admitting: Cardiology

## 2020-05-24 ENCOUNTER — Encounter: Payer: Self-pay | Admitting: Cardiology

## 2020-05-24 ENCOUNTER — Other Ambulatory Visit: Payer: Self-pay

## 2020-05-24 VITALS — BP 102/72 | HR 63 | Ht 69.5 in | Wt 212.0 lb

## 2020-05-24 DIAGNOSIS — R0789 Other chest pain: Secondary | ICD-10-CM | POA: Diagnosis not present

## 2020-05-24 DIAGNOSIS — Z7901 Long term (current) use of anticoagulants: Secondary | ICD-10-CM

## 2020-05-24 DIAGNOSIS — W57XXXA Bitten or stung by nonvenomous insect and other nonvenomous arthropods, initial encounter: Secondary | ICD-10-CM | POA: Diagnosis not present

## 2020-05-24 NOTE — Patient Instructions (Signed)
Medication Instructions:  Your physician recommends that you continue on your current medications as directed. Please refer to the Current Medication list given to you today.  *If you need a refill on your cardiac medications before your next appointment, please call your pharmacy*   Lab Work: Your physician recommends that you return for lab work in: TODAY CRP, Sed rate, Lyme serology If you have labs (blood work) drawn today and your tests are completely normal, you will receive your results only by:  MyChart Message (if you have MyChart) OR  A paper copy in the mail If you have any lab test that is abnormal or we need to change your treatment, we will call you to review the results.   Testing/Procedures: None   Follow-Up: At [facility], you and your health needs are our priority.  As part of our continuing mission to provide you with exceptional heart care, we have created designated Provider Care Teams.  These Care Teams include your primary Cardiologist (physician) and Advanced Practice Providers (APPs -  Physician Assistants and Nurse Practitioners) who all work together to provide you with the care you need, when you need it.  We recommend signing up for the patient portal called "MyChart".  Sign up information is provided on this After Visit Summary.  MyChart is used to connect with patients for Virtual Visits (Telemedicine).  Patients are able to view lab/test results, encounter notes, upcoming appointments, etc.  Non-urgent messages can be sent to your provider as well.   To learn more about what you can do with MyChart, go to [email or web address].    Your next appointment:   1 year(s)  The format for your next appointment:   In Person  Provider:   Norman Herrlich, MD   Other Instructions

## 2020-05-25 LAB — SEDIMENTATION RATE: Sed Rate: 4 mm/h (ref 0–30)

## 2020-05-25 LAB — HIGH SENSITIVITY CRP: CRP, High Sensitivity: 0.74 mg/L (ref 0.00–3.00)

## 2020-05-27 ENCOUNTER — Telehealth: Payer: Self-pay

## 2020-05-27 LAB — LYME, IGM, EARLY TEST/REFLEX: LYME DISEASE AB, QUANT, IGM: <0.8 index (ref 0.00–0.79)

## 2020-05-27 NOTE — Telephone Encounter (Signed)
Patient is returning call.  °

## 2020-05-27 NOTE — Telephone Encounter (Signed)
Left message on patients voicemail to please return our call.   

## 2020-05-27 NOTE — Telephone Encounter (Signed)
The patient has been notified of the result and verbalized understanding.  All questions (if any) were answered. Leanord Hawking, RN 05/27/2020 9:59 AM

## 2020-05-28 ENCOUNTER — Telehealth: Payer: Self-pay

## 2020-05-28 NOTE — Telephone Encounter (Signed)
Spoke with patient regarding results and recommendation.  Patient verbalizes understanding and is agreeable to plan of care. Advised patient to call back with any issues or concerns.  

## 2020-07-10 DIAGNOSIS — G4734 Idiopathic sleep related nonobstructive alveolar hypoventilation: Secondary | ICD-10-CM

## 2020-07-10 HISTORY — DX: Idiopathic sleep related nonobstructive alveolar hypoventilation: G47.34

## 2020-07-15 ENCOUNTER — Other Ambulatory Visit: Payer: Self-pay

## 2020-07-15 ENCOUNTER — Encounter (HOSPITAL_BASED_OUTPATIENT_CLINIC_OR_DEPARTMENT_OTHER): Payer: Self-pay | Admitting: *Deleted

## 2020-07-15 ENCOUNTER — Emergency Department (HOSPITAL_BASED_OUTPATIENT_CLINIC_OR_DEPARTMENT_OTHER): Payer: Managed Care, Other (non HMO)

## 2020-07-15 ENCOUNTER — Emergency Department (HOSPITAL_BASED_OUTPATIENT_CLINIC_OR_DEPARTMENT_OTHER)
Admission: EM | Admit: 2020-07-15 | Discharge: 2020-07-15 | Disposition: A | Discharge status: Home / Self Care | Payer: Managed Care, Other (non HMO) | Attending: Emergency Medicine | Admitting: Emergency Medicine

## 2020-07-15 DIAGNOSIS — J449 Chronic obstructive pulmonary disease, unspecified: Secondary | ICD-10-CM | POA: Diagnosis not present

## 2020-07-15 DIAGNOSIS — Z87891 Personal history of nicotine dependence: Secondary | ICD-10-CM | POA: Insufficient documentation

## 2020-07-15 DIAGNOSIS — R0781 Pleurodynia: Secondary | ICD-10-CM | POA: Diagnosis present

## 2020-07-15 DIAGNOSIS — U071 COVID-19: Secondary | ICD-10-CM | POA: Insufficient documentation

## 2020-07-15 DIAGNOSIS — K5900 Constipation, unspecified: Secondary | ICD-10-CM

## 2020-07-15 DIAGNOSIS — R1012 Left upper quadrant pain: Secondary | ICD-10-CM | POA: Insufficient documentation

## 2020-07-15 HISTORY — DX: COVID-19: U07.1

## 2020-07-15 LAB — COMPREHENSIVE METABOLIC PANEL
ALT: 17 U/L (ref 0–44)
AST: 19 U/L (ref 15–41)
Albumin: 4 g/dL (ref 3.5–5.0)
Alkaline Phosphatase: 46 U/L (ref 38–126)
Anion gap: 9 (ref 5–15)
BUN: 10 mg/dL (ref 6–20)
CO2: 24 mmol/L (ref 22–32)
Calcium: 8.7 mg/dL — ABNORMAL LOW (ref 8.9–10.3)
Chloride: 103 mmol/L (ref 98–111)
Creatinine, Ser: 0.82 mg/dL (ref 0.61–1.24)
GFR calc Af Amer: >60 mL/min (ref >60)
GFR calc non Af Amer: >60 mL/min (ref >60)
Glucose, Bld: 112 mg/dL — ABNORMAL HIGH (ref 70–99)
Potassium: 4 mmol/L (ref 3.5–5.1)
Sodium: 136 mmol/L (ref 135–145)
Total Bilirubin: 0.5 mg/dL (ref 0.3–1.2)
Total Protein: 6.9 g/dL (ref 6.5–8.1)

## 2020-07-15 LAB — URINALYSIS, ROUTINE W REFLEX MICROSCOPIC
Bilirubin Urine: NEGATIVE
Glucose, UA: NEGATIVE mg/dL
Hgb urine dipstick: NEGATIVE
Ketones, ur: NEGATIVE mg/dL
Leukocytes,Ua: NEGATIVE
Nitrite: NEGATIVE
Protein, ur: NEGATIVE mg/dL
Specific Gravity, Urine: 1.01 (ref 1.005–1.030)
pH: 5.5 (ref 5.0–8.0)

## 2020-07-15 LAB — CBC WITH DIFFERENTIAL/PLATELET
Abs Immature Granulocytes: 0.01 10*3/uL (ref 0.00–0.07)
Basophils Absolute: 0 10*3/uL (ref 0.0–0.1)
Basophils Relative: 1 %
Eosinophils Absolute: 0.1 10*3/uL (ref 0.0–0.5)
Eosinophils Relative: 1 %
HCT: 40.9 % (ref 39.0–52.0)
Hemoglobin: 13.5 g/dL (ref 13.0–17.0)
Immature Granulocytes: 0 %
Lymphocytes Relative: 23 %
Lymphs Abs: 1.4 10*3/uL (ref 0.7–4.0)
MCH: 30.2 pg (ref 26.0–34.0)
MCHC: 33 g/dL (ref 30.0–36.0)
MCV: 91.5 fL (ref 80.0–100.0)
Monocytes Absolute: 0.4 10*3/uL (ref 0.1–1.0)
Monocytes Relative: 6 %
Neutro Abs: 4.1 10*3/uL (ref 1.7–7.7)
Neutrophils Relative %: 69 %
Platelets: 195 10*3/uL (ref 150–400)
RBC: 4.47 MIL/uL (ref 4.22–5.81)
RDW: 13.3 % (ref 11.5–15.5)
WBC: 6 10*3/uL (ref 4.0–10.5)
nRBC: 0 % (ref 0.0–0.2)

## 2020-07-15 MED ORDER — BISACODYL EC 5 MG PO TBEC: 5.0000 mg | DELAYED_RELEASE_TABLET | Freq: Every day | ORAL | 0 refills | Status: DC | PRN

## 2020-07-15 MED ORDER — DICYCLOMINE HCL 20 MG PO TABS: 20.0000 mg | ORAL_TABLET | Freq: Two times a day (BID) | ORAL | 0 refills | Status: DC

## 2020-07-15 MED ORDER — IOHEXOL 350 MG/ML SOLN
100.0000 mL | Freq: Once | INTRAVENOUS | Status: AC | PRN
  Administered 2020-07-15: 100 mL via INTRAVENOUS

## 2020-07-15 NOTE — Discharge Instructions (Signed)
Get help right away if: ?You have a fever and your symptoms suddenly get worse. ?You leak stool or have blood in your stool. ?Your abdomen is bloated. ?You have severe pain in your abdomen. ?You feel dizzy or you faint. ?

## 2020-07-15 NOTE — ED Provider Notes (Signed)
[facility] EMERGENCY DEPARTMENT Provider Note   CSN: [id number] Arrival date & time: 07/15/20  1506     History Chief Complaint  Patient presents with  . Shortness of Breath    Patrick Gibbs is a 54 y.o. male who presents with c/o Left lower  Rib cage pain and fatigue. The paitent had COVID ddx on July 28.; He was admitted to [facility] for 3 days. He was treated with Augmentin. He just finished 5 days azithromycin which he felt helped. The patient feels that his fatigue is the worst issue. He has a hx of PE. He denies sob, pelurisy, fever, palpitations or cough. He denies n/v/d. He has LUQ abdominal pain.  HPI     Past Medical History:  Diagnosis Date  . Anterior neck pain   . Anticoagulated   . Anxiety   . Chronic fatigue   . Class 1 obesity due to excess calories with serious comorbidity and body mass index (BMI) of 30.0 to 30.9 in adult   . COPD (chronic obstructive pulmonary disease) (HCC)   . COVID-19   . Erectile dysfunction   . History of pulmonary embolus (PE)   . Hyperlipidemia   . Hyperopia with presbyopia of both eyes   . Keratoconjunctivitis of both eyes   . Mild concentric left ventricular hypertrophy (LVH)   . Nocturnal leg cramps   . Palmar wart   . Palpitations   . Precordial chest pain   . PVC's (premature ventricular contractions)   . Rosacea   . Vitreous floater, bilateral     Patient Active Problem List   Diagnosis Date Noted  . Atypical chest pain 03/08/2020  . Shortness of breath 03/08/2020  . Fatigue 03/08/2020  . Pain of upper abdomen 04/25/2019  . Neuritis 04/24/2019  . Paresthesia and pain of both upper extremities 04/24/2019  . Paresthesia of both lower extremities 04/24/2019  . Anxiety about health 04/29/2018  . Stage 1 mild COPD by GOLD classification (HCC) 04/29/2018  . Chronic anticoagulation 04/13/2018  . Mild concentric left ventricular hypertrophy (LVH) 02/24/2018  . PVC's (premature ventricular contractions) 02/24/2018    . Bilateral pulmonary embolism (HCC) 11/19/2017  . Palpitations 08/16/2017  . Chest pain in adult 08/15/2017  . Class 1 obesity due to excess calories with serious comorbidity and body mass index (BMI) of 30.0 to 30.9 in adult 08/05/2017  . Mixed hyperlipidemia 07/21/2017    Past Surgical History:  Procedure Laterality Date  . CARDIAC CATHETERIZATION  12/2018  . COLONOSCOPY    . ESOPHAGOGASTRODUODENOSCOPY    . TONSILLECTOMY         Family History  Problem Relation Age of Onset  . Cataracts Mother   . CAD Mother   . Cataracts Father   . Diabetes Father   . Cataracts Maternal Grandmother   . Cataracts Paternal Grandmother   . Cataracts Paternal Grandfather   . Heart attack Maternal Uncle   . Heart attack Maternal Uncle     Social History   Tobacco Use  . Smoking status: Former Smoker    Packs/day: 1.00    Years: 0.50    Pack years: 0.50    Types: Cigarettes    Quit date: 2008    Years since quitting: 13.6  . Smokeless tobacco: Never Used  Vaping Use  . Vaping Use: Never used  Substance Use Topics  . Alcohol use: Yes    Comment: rarely  . Drug use: Never    Home Medications Prior to Admission medications  Medication Sig Start Date End Date Taking? Authorizing Provider  LORazepam (ATIVAN) 1 MG tablet Take 0.5 mg by mouth 2 (two) times daily.    [provider]  Tiotropium Bromide Monohydrate (SPIRIVA RESPIMAT IN) Inhale 2 puffs into the lungs daily as needed.    [provider]  warfarin (COUMADIN) 5 MG tablet TAKE 2 TABLETS BY MOUTH DAILY AT 6 PM, EXCEPT 2 AND 1/2 TABLETS ON MONDAY, WEDNESDAY, FRIDAY AS DIRECTED 09/13/19   [provider]    Allergies    Ciprofloxacin, Escitalopram, and Cymbalta [duloxetine hcl]  Review of Systems   Review of Systems Ten systems reviewed and are negative for acute change, except as noted in the HPI.  Physical Exam Updated Vital Signs BP (!) 152/116   Pulse 94   Temp 98.6 F (37 C)   Resp  18   Ht 5' 9.5" (1.765 m)   Wt 99.8 kg   SpO2 98%   BMI 32.02 kg/m   Physical Exam Vitals and nursing note reviewed.  Constitutional:      General: He is not in acute distress.    Appearance: He is well-developed. He is not diaphoretic.  HENT:     Head: Normocephalic and atraumatic.  Eyes:     General: No scleral icterus.    Conjunctiva/sclera: Conjunctivae normal.  Cardiovascular:     Rate and Rhythm: Normal rate and regular rhythm.     Heart sounds: Normal heart sounds.  Pulmonary:     Effort: Pulmonary effort is normal. No respiratory distress.     Breath sounds: Normal breath sounds.  Abdominal:     Palpations: Abdomen is soft.     Tenderness: There is abdominal tenderness in the left upper quadrant.  Musculoskeletal:     Cervical back: Normal range of motion and neck supple.  Skin:    General: Skin is warm and dry.  Neurological:     Mental Status: He is alert.  Psychiatric:        Behavior: Behavior normal.     ED Results / Procedures / Treatments   Labs (all labs ordered are listed, but only abnormal results are displayed) Labs Reviewed  CBC WITH DIFFERENTIAL/PLATELET  COMPREHENSIVE METABOLIC PANEL    EKG None  Radiology DG Chest Portable 1 View  Result Date: 07/15/2020 CLINICAL DATA:  Shortness of breath. COVID-19 positive test 2 weeks ago. EXAM: PORTABLE CHEST 1 VIEW COMPARISON:  06/13/2020 FINDINGS: The heart size and mediastinal contours are within normal limits. Both lungs are clear. The visualized skeletal structures are unremarkable. IMPRESSION: Normal examination. Electronically Signed   By: Beckie Salts M.D.   On: 07/15/2020 16:08    Procedures Procedures (including critical care time)  Medications Ordered in ED Medications - No data to display  ED Course  I have reviewed the triage vital signs and the nursing notes.  Pertinent labs & imaging results that were available during my care of the patient were reviewed by me and considered in my  medical decision making (see chart for details).    MDM Rules/Calculators/A&P                          54 year old male here with complaint of diffuse intermittent abdominal pain and fatigue after recent Covid infection.The differential diagnosis for generalized abdominal pain includes, but is not limited to AAA, gastroenteritis, appendicitis, Bowel obstruction, Bowel perforation. Gastroparesis, DKA, Hernia, Inflammatory bowel disease, mesenteric ischemia, pancreatitis, peritonitis SBP, volvulus.patient also concerned about  non-pleuritic chest pain.  The emergent differential diagnosis of chest pain includes: Acute coronary syndrome, pericarditis, aortic dissection, pulmonary embolism, tension pneumothorax, pneumonia, and esophageal rupture.  I personally ordered and interpreted and reviewed labs which include CBC which shows no acute abnormalities, CMP with mildly elevated blood glucose of insignificant value, urine without evidence of infection.  I reviewed images of the portable 1 view chest x-ray, CT abdomen pelvis and CT angiogram of the chest all of which showed no acute abnormalities.  Patient does have a very large stool burden in his abdomen which is likely the cause of his acute sharp intermittent abdominal pain.  Patient does not appear to have any emergent cause of his symptoms.  Patrick Gibbs was evaluated in Emergency Department on 07/15/2020 for the symptoms described in the history of present illness. He was evaluated in the context of the global COVID-19 pandemic, which necessitated consideration that the patient might be at risk for infection with the SARS-CoV-2 virus that causes COVID-19. Institutional protocols and algorithms that pertain to the evaluation of patients at risk for COVID-19 are in a state of rapid change based on information released by regulatory bodies including the CDC and federal and state organizations. These policies and algorithms were followed during the patient's  care in the ED.  Final Clinical Impression(s) / ED Diagnoses Final diagnoses:  None    Rx / DC Orders ED Discharge Orders    None       Arthor Captain, PA-C 07/16/20 1841    Benjiman Core, MD 07/16/20 2230

## 2020-07-15 NOTE — ED Triage Notes (Signed)
Pt c/o COVID x 2 weeks , sent here for  eval , pneumonia vs PE

## 2020-08-07 DIAGNOSIS — R072 Precordial pain: Secondary | ICD-10-CM | POA: Insufficient documentation

## 2020-08-07 DIAGNOSIS — H16203 Unspecified keratoconjunctivitis, bilateral: Secondary | ICD-10-CM | POA: Insufficient documentation

## 2020-08-07 DIAGNOSIS — J449 Chronic obstructive pulmonary disease, unspecified: Secondary | ICD-10-CM | POA: Insufficient documentation

## 2020-08-07 DIAGNOSIS — G4762 Sleep related leg cramps: Secondary | ICD-10-CM | POA: Insufficient documentation

## 2020-08-07 DIAGNOSIS — M542 Cervicalgia: Secondary | ICD-10-CM | POA: Insufficient documentation

## 2020-08-07 DIAGNOSIS — Z7901 Long term (current) use of anticoagulants: Secondary | ICD-10-CM | POA: Insufficient documentation

## 2020-08-07 DIAGNOSIS — E785 Hyperlipidemia, unspecified: Secondary | ICD-10-CM | POA: Insufficient documentation

## 2020-08-07 DIAGNOSIS — Z86711 Personal history of pulmonary embolism: Secondary | ICD-10-CM | POA: Insufficient documentation

## 2020-08-07 DIAGNOSIS — H43393 Other vitreous opacities, bilateral: Secondary | ICD-10-CM | POA: Insufficient documentation

## 2020-08-07 DIAGNOSIS — U071 COVID-19: Secondary | ICD-10-CM | POA: Insufficient documentation

## 2020-08-07 DIAGNOSIS — L719 Rosacea, unspecified: Secondary | ICD-10-CM | POA: Insufficient documentation

## 2020-08-07 DIAGNOSIS — F419 Anxiety disorder, unspecified: Secondary | ICD-10-CM | POA: Insufficient documentation

## 2020-08-07 DIAGNOSIS — B078 Other viral warts: Secondary | ICD-10-CM | POA: Insufficient documentation

## 2020-08-07 DIAGNOSIS — H5203 Hypermetropia, bilateral: Secondary | ICD-10-CM | POA: Insufficient documentation

## 2020-08-07 DIAGNOSIS — N529 Male erectile dysfunction, unspecified: Secondary | ICD-10-CM | POA: Insufficient documentation

## 2020-08-07 DIAGNOSIS — R5382 Chronic fatigue, unspecified: Secondary | ICD-10-CM | POA: Insufficient documentation

## 2020-08-07 NOTE — Progress Notes (Signed)
Cardiology Office Note:    Date:  08/08/2020   ID:  Patrick Gibbs, DOB [birth year], MRN [id number]  PCP:  Leola Brazil, DO  Cardiologist:  Norman Herrlich, MD    Referring MD: Leola Brazil, DO    ASSESSMENT:    1. COVID-19   2. PVC's (premature ventricular contractions)   3. Chest pain of uncertain etiology   4. Chronic anticoagulation    PLAN:    In order of problems listed above:  1. His cardiac symptoms have worsened following COVID-19 pneumonia 06/13/2020.  He has PVCs present on his EKG Will use a 7-day ZIO monitor to assess burden and decide if he needs treatment.  At times it is uncommon but myocardial involvement can be seen with cardiomyopathy and echocardiogram.  I do not think he requires a repeat ischemia evaluation. 2. Continue anticoagulation with pulmonary embolism unprovoked   Next appointment: 6 weeks   Medication Adjustments/Labs and Tests Ordered: Current medicines are reviewed at length with the patient today.  Concerns regarding medicines are outlined above.  No orders of the defined types were placed in this encounter.  No orders of the defined types were placed in this encounter.   No chief complaint on file.   History of Present Illness:    Patrick Gibbs is a 54 y.o. male with a hx of  recurrent chest pain with left heart catheterization in February 2020 showed no significant CAD noted, chronic fatigue, anxiety, bilateral pulmonary embolism on warfarin which is managed by the clinic in [facility] last seen 04/02/2020.  Although has had normal coronary angiography in the past last visit was concerned of microvascular coronary disease underwent cardiac CTA.  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  He was last seen 05/24/2020.  Main concern of unrecognized heart disease and his request he had both a sedimentation rate and CRP to screen for inflammatory  findings of vasculitis and Lyme's testing.  He and sedimentation rate were low and testing for Lyme's disease was negative.  Compliance with diet, lifestyle and medications: Yes  A COVID-19 pneumonia treated [facility] CTA performed 06/13/2020 shows extensive infiltrates. IMPRESSION:  1. No evidence of acute pulmonary embolism to the lobar branch  level.  2. Dense airspace consolidation with surrounding ground-glass  opacity within the medial aspect of the right lower lobe. Multifocal  areas of ground-glass opacity within the right upper lobe, left  upper lobe, and left lower lobe. Findings compatible with multifocal  pneumonia in the setting of known COVID-19 infection.  3. Enlarged right hilar lymph node, likely reactive.  4. Small hiatal hernia.   He has not not done well since he has had Covid he has exercise intolerance shortness of breath and he has had his ongoing chest pain he has had multiple coronary angiograms and cardiac CTA no indication of acute coronary syndrome and I would not repeat an ischemia evaluation.  Some his chest pain sounds pleuritic at times she can get myocardial involvement will get a check an echocardiogram and with his recurrent palpitation and PVCs noticed in the hospital 7-day ZIO monitor. Past Medical History:  Diagnosis Date  . Anterior neck pain   . Anticoagulated   . Anxiety   . Atypical chest pain 03/08/2020  . Bilateral pulmonary embolism (HCC) 11/19/2017   Monitored by hematology and prescribed xarelto  . Chest pain in adult 08/15/2017  . Chronic  anticoagulation 04/13/2018  . Chronic fatigue   . Class 1 obesity due to excess calories with serious comorbidity and body mass index (BMI) of 30.0 to 30.9 in adult   . COPD (chronic obstructive pulmonary disease) (HCC)   . COVID-19   . Erectile dysfunction   . Fatigue 03/08/2020  . History of pulmonary embolus (PE)   . Hyperlipidemia   . Hyperopia with presbyopia of both eyes   .  Keratoconjunctivitis of both eyes   . Mild concentric left ventricular hypertrophy (LVH)   . Mixed hyperlipidemia 07/21/2017  . Neuritis 04/24/2019  . Nocturnal leg cramps   . Pain of upper abdomen 04/25/2019  . Palmar wart   . Palpitations   . Paresthesia and pain of both upper extremities 04/24/2019  . Paresthesia of both lower extremities 04/24/2019  . Precordial chest pain   . PVC's (premature ventricular contractions)   . Rosacea   . Shortness of breath 03/08/2020  . Stage 1 mild COPD by GOLD classification (HCC) 04/29/2018   PFT 04/07/18 ratio 63% FEV1 70% FVC 89%  . Vitreous floater, bilateral     Past Surgical History:  Procedure Laterality Date  . CARDIAC CATHETERIZATION  12/2018  . COLONOSCOPY    . ESOPHAGOGASTRODUODENOSCOPY    . TONSILLECTOMY      Current Medications: Current Meds  Medication Sig  . acetaminophen (TYLENOL) 500 MG tablet Take by mouth.  Marland Kitchen amLODipine (NORVASC) 2.5 MG tablet Take 2.5 mg by mouth daily.  Marland Kitchen LORazepam (ATIVAN) 1 MG tablet Take 0.5 mg by mouth 2 (two) times daily.  . Tiotropium Bromide Monohydrate (SPIRIVA RESPIMAT IN) Inhale 2 puffs into the lungs daily as needed.  . warfarin (COUMADIN) 5 MG tablet TAKE 2 TABLETS BY MOUTH DAILY AT 6 PM, EXCEPT 2 AND 1/2 TABLETS ON MONDAY, WEDNESDAY, FRIDAY AS DIRECTED     Allergies:   Ciprofloxacin, Escitalopram, and Cymbalta [duloxetine hcl]   Social History   Socioeconomic History  . Marital status: Single    Spouse name: Not on file  . Number of children: Not on file  . Years of education: Not on file  . Highest education level: Not on file  Occupational History  . Not on file  Tobacco Use  . Smoking status: Former Smoker    Packs/day: 1.00    Years: 0.50    Pack years: 0.50    Types: Cigarettes    Quit date: 2008    Years since quitting: 13.7  . Smokeless tobacco: Never Used  Vaping Use  . Vaping Use: Never used  Substance and Sexual Activity  . Alcohol use: Yes    Comment: rarely  .  Drug use: Never  . Sexual activity: Not on file  Other Topics Concern  . Not on file  Social History Narrative  . Not on file   Social Determinants of Health   Financial Resource Strain:   . Difficulty of Paying Living Expenses: Not on file  Food Insecurity:   . Worried About Programme researcher, broadcasting/film/video in the Last Year: Not on file  . Ran Out of Food in the Last Year: Not on file  Transportation Needs:   . Lack of Transportation (Medical): Not on file  . Lack of Transportation (Non-Medical): Not on file  Physical Activity:   . Days of Exercise per Week: Not on file  . Minutes of Exercise per Session: Not on file  Stress:   . Feeling of Stress : Not on file  Social Connections:   .  Frequency of Communication with Friends and Family: Not on file  . Frequency of Social Gatherings with Friends and Family: Not on file  . Attends Religious Services: Not on file  . Active Member of Clubs or Organizations: Not on file  . Attends Banker Meetings: Not on file  . Marital Status: Not on file     Family History: The patient's family history includes CAD in his mother; Cataracts in his father, maternal grandmother, mother, paternal grandfather, and paternal grandmother; Diabetes in his father; Heart attack in his maternal uncle and maternal uncle. ROS:   Please see the history of present illness.    All other systems reviewed and are negative.  EKGs/Labs/Other Studies Reviewed:    The following studies were reviewed today:  EKG:  EKG ordered today and personally reviewed.  The ekg ordered today demonstrates sinus rhythm with 2 PVCs otherwise normal  Recent Labs: 03/08/2020: Magnesium 2.3; NT-Pro BNP 67; TSH 1.210 07/15/2020: ALT 17; BUN 10; Creatinine, Ser 0.82; Hemoglobin 13.5; Platelets 195; Potassium 4.0; Sodium 136  Recent Lipid Panel    Component Value Date/Time   CHOL 147 03/01/2020 1120   TRIG 61 03/01/2020 1120   HDL 32 (L) 03/01/2020 1120   CHOLHDL 4.6 03/01/2020  1120   LDLCALC 102 (H) 03/01/2020 1120    Physical Exam:    VS:  BP (!) 148/80   Pulse 74   Ht 5' 9.5" (1.765 m)   Wt 209 lb (94.8 kg)   SpO2 98%   BMI 30.42 kg/m     Wt Readings from Last 3 Encounters:  08/08/20 209 lb (94.8 kg)  07/15/20 220 lb (99.8 kg)  05/24/20 212 lb (96.2 kg)     GEN:  Well nourished, well developed in no acute distress HEENT: Normal NECK: No JVD; No carotid bruits LYMPHATICS: No lymphadenopathy CARDIAC: RRR, no murmurs, rubs, gallops RESPIRATORY:  Clear to auscultation without rales, wheezing or rhonchi  ABDOMEN: Soft, non-tender, non-distended MUSCULOSKELETAL:  No edema; No deformity  SKIN: Warm and dry NEUROLOGIC:  Alert and oriented x 3 PSYCHIATRIC:  Normal affect    Signed, Norman Herrlich, MD  08/08/2020 10:30 AM    [facility]

## 2020-08-08 ENCOUNTER — Encounter: Payer: Self-pay | Admitting: Cardiology

## 2020-08-08 ENCOUNTER — Other Ambulatory Visit: Payer: Self-pay

## 2020-08-08 ENCOUNTER — Ambulatory Visit (INDEPENDENT_AMBULATORY_CARE_PROVIDER_SITE_OTHER): Payer: Managed Care, Other (non HMO)

## 2020-08-08 ENCOUNTER — Ambulatory Visit (INDEPENDENT_AMBULATORY_CARE_PROVIDER_SITE_OTHER): Payer: Managed Care, Other (non HMO) | Admitting: Cardiology

## 2020-08-08 VITALS — BP 148/80 | HR 74 | Ht 69.5 in | Wt 209.0 lb

## 2020-08-08 DIAGNOSIS — I493 Ventricular premature depolarization: Secondary | ICD-10-CM | POA: Diagnosis not present

## 2020-08-08 DIAGNOSIS — U071 COVID-19: Secondary | ICD-10-CM

## 2020-08-08 DIAGNOSIS — R002 Palpitations: Secondary | ICD-10-CM

## 2020-08-08 DIAGNOSIS — Z7901 Long term (current) use of anticoagulants: Secondary | ICD-10-CM | POA: Diagnosis not present

## 2020-08-08 DIAGNOSIS — R079 Chest pain, unspecified: Secondary | ICD-10-CM | POA: Diagnosis not present

## 2020-08-08 DIAGNOSIS — R0602 Shortness of breath: Secondary | ICD-10-CM

## 2020-08-08 NOTE — Patient Instructions (Signed)
Medication Instructions:  Your physician recommends that you continue on your current medications as directed. Please refer to the Current Medication list given to you today.  *If you need a refill on your cardiac medications before your next appointment, please call your pharmacy*   Lab Work: None If you have labs (blood work) drawn today and your tests are completely normal, you will receive your results only by: . MyChart Message (if you have MyChart) OR . A paper copy in the mail If you have any lab test that is abnormal or we need to change your treatment, we will call you to review the results.   Testing/Procedures: Your physician has requested that you have an echocardiogram. Echocardiography is a painless test that uses sound waves to create images of your heart. It provides your doctor with information about the size and shape of your heart and how well your heart's chambers and valves are working. This procedure takes approximately one hour. There are no restrictions for this procedure.  A zio monitor was ordered today. It will remain on for 7 days. You will then return monitor and event diary in provided box. It takes 1-2 weeks for report to be downloaded and returned to us. We will call you with the results. If monitor falls off or has orange flashing light, please call Zio for further instructions.      Follow-Up: At [facility], you and your health needs are our priority.  As part of our continuing mission to provide you with exceptional heart care, we have created designated Provider Care Teams.  These Care Teams include your primary Cardiologist (physician) and Advanced Practice Providers (APPs -  Physician Assistants and Nurse Practitioners) who all work together to provide you with the care you need, when you need it.  We recommend signing up for the patient portal called "MyChart".  Sign up information is provided on this After Visit Summary.  MyChart is used to connect  with patients for Virtual Visits (Telemedicine).  Patients are able to view lab/test results, encounter notes, upcoming appointments, etc.  Non-urgent messages can be sent to your provider as well.   To learn more about what you can do with MyChart, go to [email or web address].    Your next appointment:   6 week(s)  The format for your next appointment:   In Person  Provider:   Brian Munley, MD   Other Instructions   

## 2020-08-14 DIAGNOSIS — R419 Unspecified symptoms and signs involving cognitive functions and awareness: Secondary | ICD-10-CM | POA: Insufficient documentation

## 2020-08-23 ENCOUNTER — Telehealth: Payer: Self-pay

## 2020-08-23 MED ORDER — ACEBUTOLOL HCL 200 MG PO CAPS: 200.0000 mg | ORAL_CAPSULE | Freq: Every day | ORAL | 3 refills | Status: DC

## 2020-08-23 MED ORDER — ACEBUTOLOL HCL 200 MG PO CAPS
200.0000 mg | ORAL_CAPSULE | Freq: Every day | ORAL | 3 refills | Status: DC
Start: 2020-08-23 — End: 2021-04-01

## 2020-08-23 NOTE — Telephone Encounter (Signed)
-----   Message from Baldo Daub, MD sent at 08/23/2020  8:06 AM EDT ----- His monitor shows frequent single PVCs similar to what was seen on the EKG and these are the events that he notices.  Not life-threatening but it can be a nuisance I would like to put him on a low-dose of beta-blocker to see if I can alleviate his symptoms Sectral 200 mg twice daily.

## 2020-08-23 NOTE — Addendum Note (Signed)
Addended by: Delorse Limber I on: 08/23/2020 08:48 AM   Modules accepted: Orders

## 2020-08-23 NOTE — Telephone Encounter (Signed)
Spoke with patient regarding results and recommendation.  Patient verbalizes understanding and is agreeable to plan of care. Advised patient to call back with any issues or concerns.  

## 2020-08-30 ENCOUNTER — Ambulatory Visit (INDEPENDENT_AMBULATORY_CARE_PROVIDER_SITE_OTHER): Payer: Managed Care, Other (non HMO)

## 2020-08-30 ENCOUNTER — Other Ambulatory Visit: Payer: Self-pay

## 2020-08-30 ENCOUNTER — Telehealth: Payer: Self-pay

## 2020-08-30 DIAGNOSIS — R0602 Shortness of breath: Secondary | ICD-10-CM | POA: Diagnosis not present

## 2020-08-30 DIAGNOSIS — I493 Ventricular premature depolarization: Secondary | ICD-10-CM | POA: Diagnosis not present

## 2020-08-30 DIAGNOSIS — Z7901 Long term (current) use of anticoagulants: Secondary | ICD-10-CM

## 2020-08-30 DIAGNOSIS — U071 COVID-19: Secondary | ICD-10-CM

## 2020-08-30 DIAGNOSIS — R079 Chest pain, unspecified: Secondary | ICD-10-CM

## 2020-08-30 LAB — ECHOCARDIOGRAM COMPLETE
Area-P 1/2: 3.77 cm2
S' Lateral: 3.48 cm

## 2020-08-30 NOTE — Telephone Encounter (Signed)
-----   Message from Baldo Daub, MD sent at 08/30/2020  4:13 PM EDT ----- Good result no evidence of heart disease particularly heart failure or other diseases affecting the heart muscle

## 2020-08-30 NOTE — Telephone Encounter (Signed)
Spoke with patient regarding results and recommendation.  Patient verbalizes understanding and is agreeable to plan of care. Advised patient to call back with any issues or concerns.  

## 2020-08-30 NOTE — Progress Notes (Signed)
2D Echocardiogram has been completed.  Elena Cothern, RCS 

## 2020-09-02 ENCOUNTER — Telehealth: Payer: Self-pay | Admitting: Cardiology

## 2020-09-02 MED ORDER — ISOSORBIDE MONONITRATE ER 30 MG PO TB24: 15.0000 mg | ORAL_TABLET | Freq: Every day | ORAL | 1 refills | Status: DC

## 2020-09-02 NOTE — Telephone Encounter (Signed)
Called patient. Patient reports since last Wednesday he has been having intermittent chest pain on his left side. He has been having some radiation into neck and jaw at times. No chest pain while on the call with me. No shortness of breath. He just walked up hill with his dog with no issues. He is not taking acebutolol due to his heart rate already in the 50s he doesn't want to decrease even more. He does not want to go to emergency department. Will consult with Dr. Servando Salina.

## 2020-09-02 NOTE — Telephone Encounter (Signed)
Called patient. Informed him of Dr. Mallory Shirk recommendation. Scheduled him for sooner appointment with Dr. Dulce Sellar and sent in Imdur 15 mg daily for the patient. While on the phone he reports he had another episode of left sided chest pain earlier and his arm was tingling and he got very hot like he was going to break out in a sweat. I advised him that if that returns he needs to go to the emergency department. He verbally understood. Will inform Dr. Servando Salina as well.

## 2020-09-02 NOTE — Telephone Encounter (Signed)
Patient was informed of the recommendation to go to the emergency department if symptoms persist.

## 2020-09-02 NOTE — Telephone Encounter (Signed)
Please let the patient know that,If he continues to have the symptoms to please go to the emergency room despite the Imdur

## 2020-09-02 NOTE — Telephone Encounter (Signed)
Pt c/o of Chest Pain: STAT if CP now or developed within 24 hours  1. Are you having CP right now? No  2. Are you experiencing any other symptoms (ex. SOB, nausea, vomiting, sweating)? No   3. How long have you been experiencing CP? Pt stated it started last wed.    4. Is your CP continuous or coming and going? Comes and goes .  stabbing , burning pain around his nipple area .    5. Have you taken Nitroglycerin? No   Pt has an appt on 11/11 to fu but he would like to know if he needs to be seen sooner  acebutolol (SECTRAL) 200 MG capsule [[id number]] - pt stated he never start taking his med.  He is concerned about taking because he read the side effects and he has a low rate already   Best number  [phone] ?

## 2020-09-02 NOTE — Telephone Encounter (Signed)
Please have him start Imdur 15 mg daily.  See if he can see Dr. Dulce Sellar sooner

## 2020-09-02 NOTE — Addendum Note (Signed)
Addended by: Hazle Quant on: 09/02/2020 03:50 PM   Modules accepted: Orders

## 2020-09-08 NOTE — Progress Notes (Signed)
Cardiology Office Note:    Date:  09/09/2020   ID:  Patrick Gibbs, DOB 1966/03/31, MRN [id number]  PCP:  Leola Brazil, DO  Cardiologist:  Norman Herrlich, MD    Referring MD: Leola Brazil, DO    ASSESSMENT:    1. Chest pain of uncertain etiology   2. Chronic anticoagulation   3. PVC's (premature ventricular contractions)    PLAN:    In order of problems listed above:  1. Ongoing chest pain much of a quite atypical and on think there is any more cardiac testing that is beneficial I have offered him reassurance and if he has more typical anginal symptoms can use nitroglycerin as needed and stop oral mononitrate's with headaches 2. Continue his anticoagulant 3. Continue his beta-blocker good response   Next appointment: 6 months   Medication Adjustments/Labs and Tests Ordered: Current medicines are reviewed at length with the patient today.  Concerns regarding medicines are outlined above.  Orders Placed This Encounter  Procedures  . EKG 12-Lead   Meds ordered this encounter  Medications  . nitroGLYCERIN (NITROSTAT) 0.4 MG SL tablet    Sig: Place 1 tablet (0.4 mg total) under the tongue every 5 (five) minutes as needed for chest pain.    Dispense:  90 tablet    Refill:  3    No chief complaint on file.   History of Present Illness:    Patrick Gibbs is a 54 y.o. male with a hx of recurrent chest pain with very extensive evaluation including multiple coronary angiograms, history of pulmonary embolism on chronic anticoagulation and COVID-19 pneumonia July 2021.  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  He was concerned that he had vasculitis or Lyme's disease and had testing performed including Lyme serology and sedimentation rate that were low or normal.. Compliance with diet, lifestyle and medications: Yes  Difficult case the multitude of  symptoms including brain fog from Covid as well as typical and atypical chest pain.  At times he gets pressure in the left chest relieved with rest but much of his symptoms are radicular sharp and occurs along the left costochondral junction.  He did not tolerate oral nitrates with headache.  I have offered him reassurance his EKG remains normal nitroglycerin if he has more typical angina at this time I do not think any further cardiac testing would be beneficial.  His palpitation has improved.  On today's EKG he has no PVCs  This year he has had a cardiac CTA performed as well as a CTA angio of the chest and abdomen and pelvis.  In 2020 he had a cardiac MR.  A ZIO monitor October 2021 showed frequent PVCs. Past Medical History:  Diagnosis Date  . Anterior neck pain   . Anticoagulated   . Anxiety   . Atypical chest pain 03/08/2020  . Bilateral pulmonary embolism (HCC) 11/19/2017   Monitored by hematology and prescribed xarelto  . Chest pain in adult 08/15/2017  . Chronic anticoagulation 04/13/2018  . Chronic fatigue   . Chronic fatigue   . Chronic fatigue   . Class 1 obesity due to excess calories with serious comorbidity and body mass index (BMI) of 30.0 to 30.9 in adult   . COPD (chronic obstructive pulmonary disease) (HCC)   . COVID-19   . Erectile dysfunction   . Fatigue 03/08/2020  . History of  pulmonary embolus (PE)   . Hyperlipidemia   . Hyperopia with presbyopia of both eyes   . Keratoconjunctivitis of both eyes   . Mild concentric left ventricular hypertrophy (LVH)   . Mixed hyperlipidemia 07/21/2017  . Neuritis 04/24/2019  . Nocturnal hypoxia 07/10/2020   Formatting of this note might be different from the original. ONO 07/05/20 desat <88% 16 min 8 sec Formatting of this note might be different from the original. Formatting of this note might be different from the original. ONO 07/05/20 desat <88% 16 min 8 sec  . Nocturnal leg cramps   . Pain of upper abdomen 04/25/2019  . Palmar wart     . Palpitations   . Paresthesia and pain of both upper extremities 04/24/2019  . Paresthesia of both lower extremities 04/24/2019  . Precordial chest pain   . PVC's (premature ventricular contractions)   . Rosacea   . Shortness of breath 03/08/2020  . Stage 1 mild COPD by GOLD classification (HCC) 04/29/2018   PFT 04/07/18 ratio 63% FEV1 70% FVC 89%  . Vitreous floater, bilateral     Past Surgical History:  Procedure Laterality Date  . CARDIAC CATHETERIZATION  12/2018  . COLONOSCOPY    . ESOPHAGOGASTRODUODENOSCOPY    . TONSILLECTOMY      Current Medications: Current Meds  Medication Sig  . acetaminophen (TYLENOL) 500 MG tablet Take by mouth.  . isosorbide mononitrate (IMDUR) 30 MG 24 hr tablet Take 0.5 tablets (15 mg total) by mouth daily.  Marland Kitchen LORazepam (ATIVAN) 1 MG tablet Take 1 mg by mouth 2 (two) times daily. Take 1 tablet am and 0.5 tablet pm  . warfarin (COUMADIN) 5 MG tablet TAKE 2 TABLETS BY MOUTH DAILY AT 6 PM, EXCEPT 2 AND 1/2 TABLETS ON MONDAY, WEDNESDAY, FRIDAY AS DIRECTED     Allergies:   Ciprofloxacin, Escitalopram, and Cymbalta [duloxetine hcl]   Social History   Socioeconomic History  . Marital status: Single    Spouse name: Not on file  . Number of children: Not on file  . Years of education: Not on file  . Highest education level: Not on file  Occupational History  . Not on file  Tobacco Use  . Smoking status: Former Smoker    Packs/day: 1.00    Years: 0.50    Pack years: 0.50    Types: Cigarettes    Quit date: 2008    Years since quitting: 13.8  . Smokeless tobacco: Never Used  Vaping Use  . Vaping Use: Never used  Substance and Sexual Activity  . Alcohol use: Yes    Comment: rarely  . Drug use: Never  . Sexual activity: Not on file  Other Topics Concern  . Not on file  Social History Narrative  . Not on file   Social Determinants of Health   Financial Resource Strain:   . Difficulty of Paying Living Expenses: Not on file  Food  Insecurity:   . Worried About Programme researcher, broadcasting/film/video in the Last Year: Not on file  . Ran Out of Food in the Last Year: Not on file  Transportation Needs:   . Lack of Transportation (Medical): Not on file  . Lack of Transportation (Non-Medical): Not on file  Physical Activity:   . Days of Exercise per Week: Not on file  . Minutes of Exercise per Session: Not on file  Stress:   . Feeling of Stress : Not on file  Social Connections:   . Frequency of Communication with  Friends and Family: Not on file  . Frequency of Social Gatherings with Friends and Family: Not on file  . Attends Religious Services: Not on file  . Active Member of Clubs or Organizations: Not on file  . Attends Banker Meetings: Not on file  . Marital Status: Not on file     Family History: The patient's family history includes CAD in his mother; Cataracts in his father, maternal grandmother, mother, paternal grandfather, and paternal grandmother; Diabetes in his father; Heart attack in his maternal uncle and maternal uncle. ROS:   Please see the history of present illness.    All other systems reviewed and are negative.  EKGs/Labs/Other Studies Reviewed:    The following studies were reviewed today:  EKG:  EKG ordered today and personally reviewed.  The ekg ordered today demonstrates sinus rhythm 1 fusion beat otherwise normal  Recent Labs: 03/08/2020: Magnesium 2.3; NT-Pro BNP 67; TSH 1.210 07/15/2020: ALT 17; BUN 10; Creatinine, Ser 0.82; Hemoglobin 13.5; Platelets 195; Potassium 4.0; Sodium 136  Recent Lipid Panel    Component Value Date/Time   CHOL 147 03/01/2020 1120   TRIG 61 03/01/2020 1120   HDL 32 (L) 03/01/2020 1120   CHOLHDL 4.6 03/01/2020 1120   LDLCALC 102 (H) 03/01/2020 1120    Physical Exam:    VS:  BP 120/79   Pulse 77   Ht 5' 9.5" (1.765 m)   Wt 215 lb (97.5 kg)   SpO2 97%   BMI 31.29 kg/m     Wt Readings from Last 3 Encounters:  09/09/20 215 lb (97.5 kg)  08/08/20 209 lb  (94.8 kg)  07/15/20 220 lb (99.8 kg)     GEN:  Well nourished, well developed in no acute distress HEENT: Normal NECK: No JVD; No carotid bruits LYMPHATICS: No lymphadenopathy CARDIAC: RRR, no murmurs, rubs, gallops RESPIRATORY:  Clear to auscultation without rales, wheezing or rhonchi  ABDOMEN: Soft, non-tender, non-distended MUSCULOSKELETAL:  No edema; No deformity  SKIN: Warm and dry NEUROLOGIC:  Alert and oriented x 3 PSYCHIATRIC:  Normal affect    Signed, Norman Herrlich, MD  09/09/2020 3:38 PM    [facility]

## 2020-09-09 ENCOUNTER — Encounter: Payer: Self-pay | Admitting: Cardiology

## 2020-09-09 ENCOUNTER — Ambulatory Visit (INDEPENDENT_AMBULATORY_CARE_PROVIDER_SITE_OTHER): Payer: Self-pay | Admitting: Cardiology

## 2020-09-09 ENCOUNTER — Other Ambulatory Visit: Payer: Self-pay

## 2020-09-09 VITALS — BP 120/79 | HR 77 | Ht 69.5 in | Wt 215.0 lb

## 2020-09-09 DIAGNOSIS — I493 Ventricular premature depolarization: Secondary | ICD-10-CM

## 2020-09-09 DIAGNOSIS — Z7901 Long term (current) use of anticoagulants: Secondary | ICD-10-CM

## 2020-09-09 DIAGNOSIS — R079 Chest pain, unspecified: Secondary | ICD-10-CM

## 2020-09-09 MED ORDER — NITROGLYCERIN 0.4 MG SL SUBL: 0.4000 mg | SUBLINGUAL_TABLET | SUBLINGUAL | 3 refills | Status: DC | PRN

## 2020-09-09 NOTE — Patient Instructions (Signed)
Medication Instructions:  Your physician has recommended you make the following change in your medication:  START: Nitroglycerin 0.4 mg take one tablet by mouth every 5 minutes as needed for chest pain.   *If you need a refill on your cardiac medications before your next appointment, please call your pharmacy*   Lab Work: None If you have labs (blood work) drawn today and your tests are completely normal, you will receive your results only by: . MyChart Message (if you have MyChart) OR . A paper copy in the mail If you have any lab test that is abnormal or we need to change your treatment, we will call you to review the results.   Testing/Procedures: None   Follow-Up: At [facility], you and your health needs are our priority.  As part of our continuing mission to provide you with exceptional heart care, we have created designated Provider Care Teams.  These Care Teams include your primary Cardiologist (physician) and Advanced Practice Providers (APPs -  Physician Assistants and Nurse Practitioners) who all work together to provide you with the care you need, when you need it.  We recommend signing up for the patient portal called "MyChart".  Sign up information is provided on this After Visit Summary.  MyChart is used to connect with patients for Virtual Visits (Telemedicine).  Patients are able to view lab/test results, encounter notes, upcoming appointments, etc.  Non-urgent messages can be sent to your provider as well.   To learn more about what you can do with MyChart, go to [email or web address].    Your next appointment:   6 month(s)  The format for your next appointment:   In Person  Provider:   Brian Munley, MD   Other Instructions   

## 2020-09-19 ENCOUNTER — Ambulatory Visit: Payer: Self-pay | Admitting: Cardiology

## 2020-11-18 IMAGING — CT CT ABD-PELV W/ CM
3 of 13 series · 12 of 46 positions shown, 17 images · IV contrast (Omnipaque)
Comparison: Chest CTA dated 06/13/2020 and limited right upper
quadrant abdomen ultrasound dated 04/08/2018.

CLINICAL DATA: Left chest and left upper quadrant abdominal pain.

EXAM:
CT ANGIOGRAPHY CHEST
CT ABDOMEN AND PELVIS WITH CONTRAST
TECHNIQUE: Multidetector CT imaging of the chest was performed using the
standard protocol during bolus administration of intravenous
contrast. Multiplanar CT image reconstructions and MIPs were
obtained to evaluate the vascular anatomy. Multidetector CT imaging
of the abdomen and pelvis was performed using the standard protocol
during bolus administration of intravenous contrast.
CONTRAST:  100mL OMNIPAQUE IOHEXOL 350 MG/ML SOLN

[Series 7: pe thins · axial · 0.96mm/px · z∈[-350,-53]mm · 8 of 341 slices shown]
[im 22/341  soft-tissue]
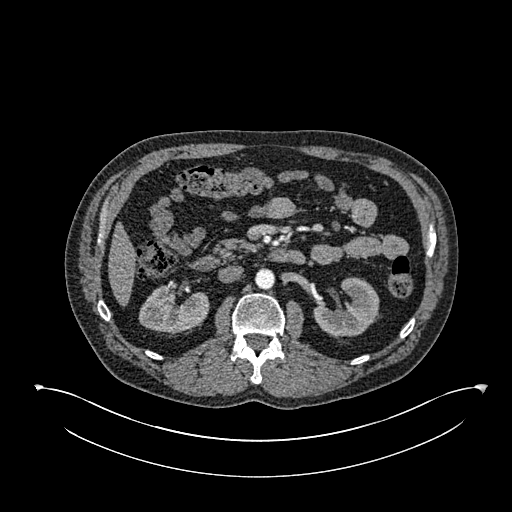
[im 64/341  soft-tissue]
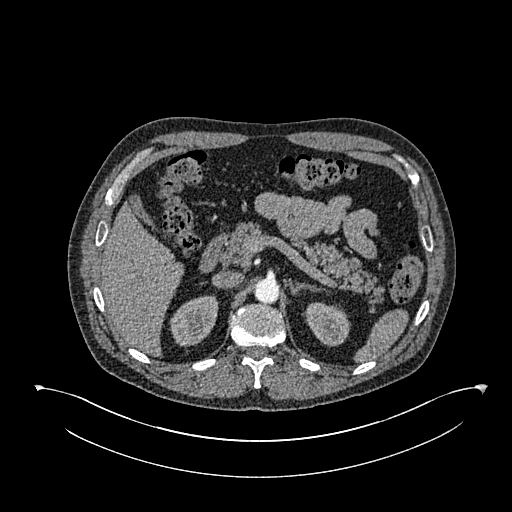
[im 107/341  soft-tissue]
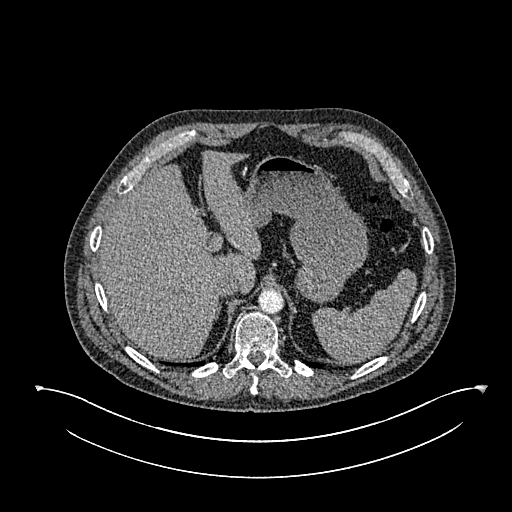
[im 149/341  soft-tissue]
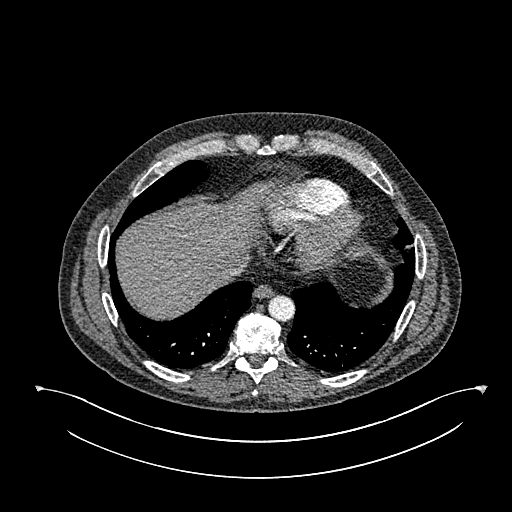
[im 192/341  soft-tissue]
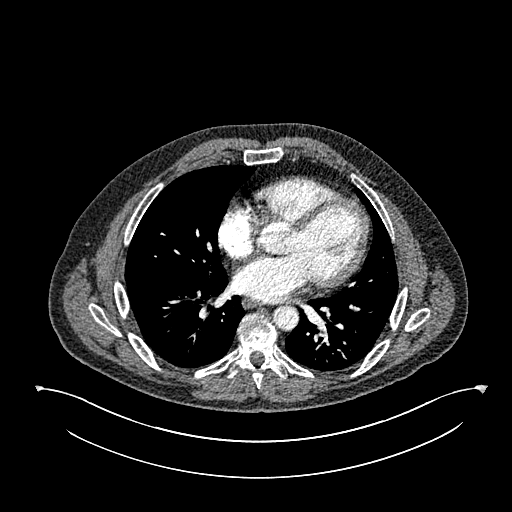
[im 234/341  soft-tissue]
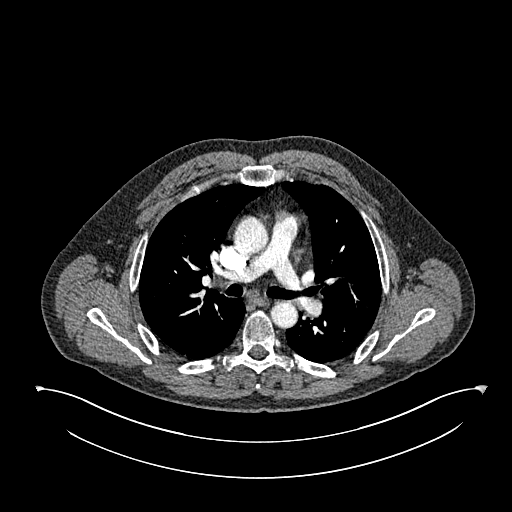
[im 277/341  soft-tissue]
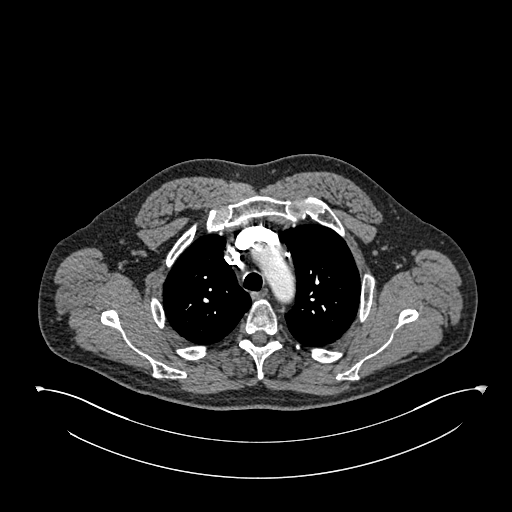
[im 319/341  soft-tissue]
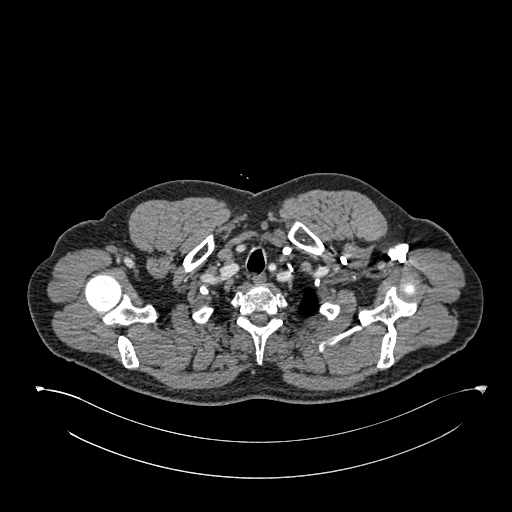

[Series 8: pe coronal mpr · coronal · 0.65mm/px · 1 of 151 slices shown, 2 images]
[im 76/151  soft-tissue]
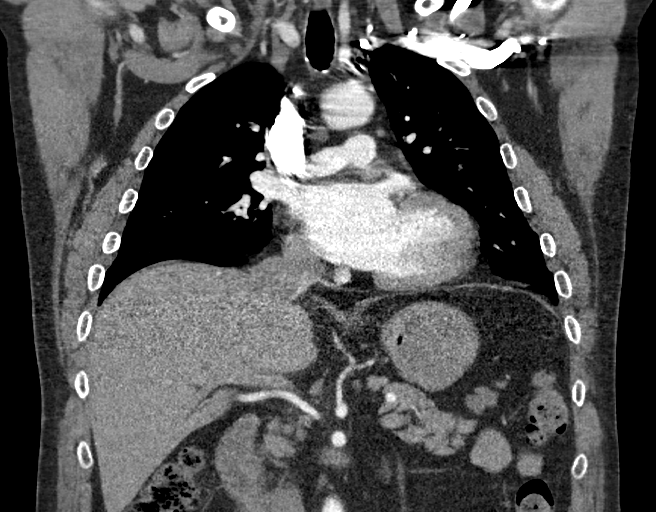
[im 76/151  bone]
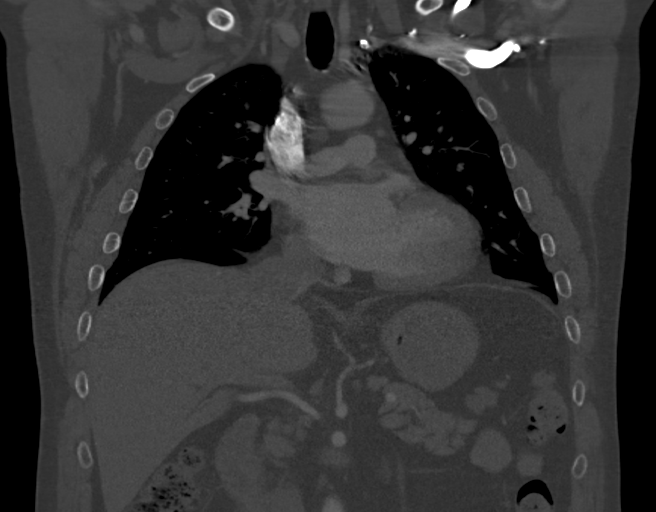

[Series 12: axial st · axial · 0.95mm/px · z∈[-568,-338]mm · 3 of 94 slices shown, 7 images]
[im 24/94  soft-tissue]
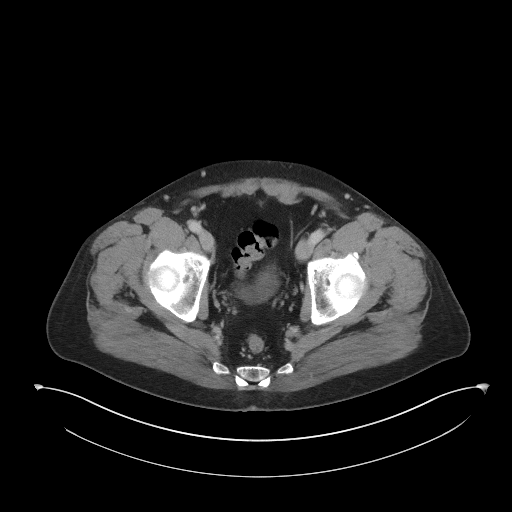
[im 24/94  lung]
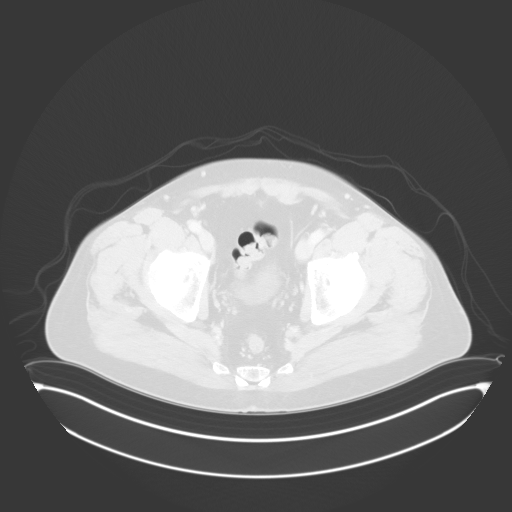
[im 24/94  bone]
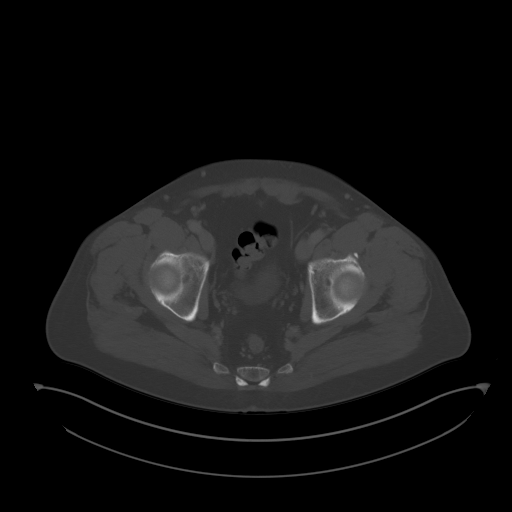
[im 47/94  soft-tissue]
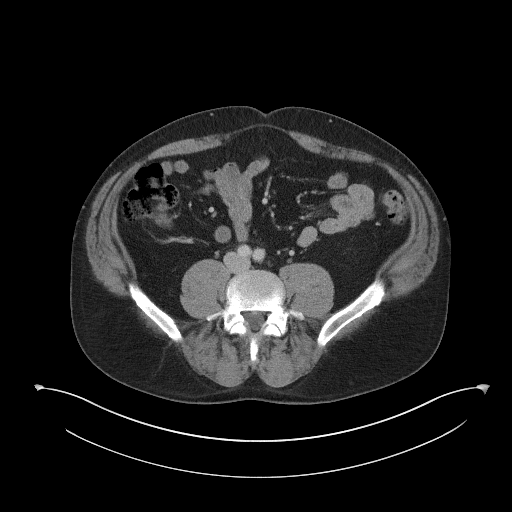
[im 47/94  lung]
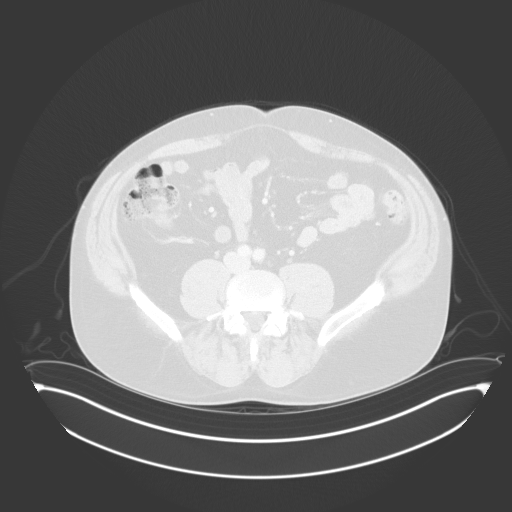
[im 70/94  soft-tissue]
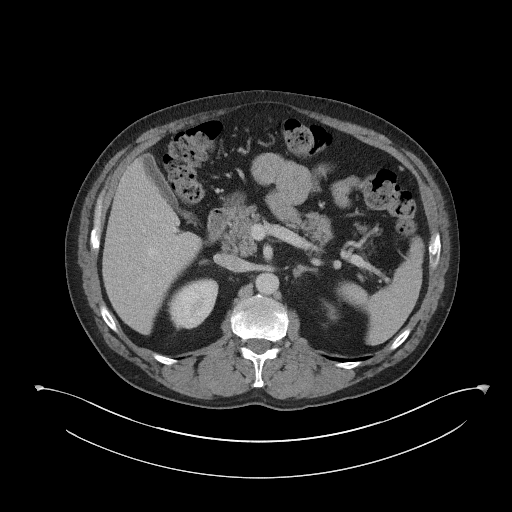
[im 70/94  lung]
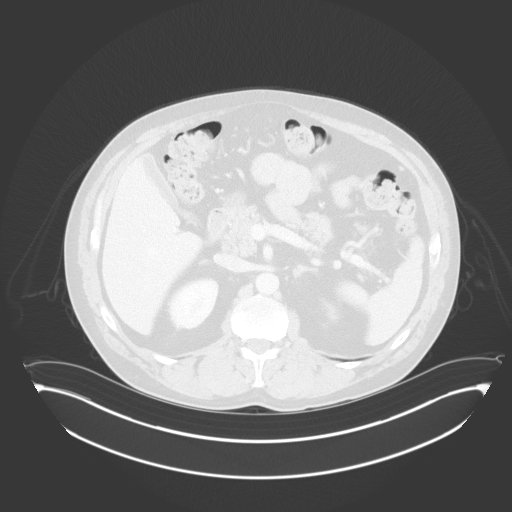

[12 of 46 positions shown; findings below may reference images not displayed]

FINDINGS: CTA CHEST FINDINGS

Cardiovascular: Satisfactory opacification of the pulmonary arteries
to the segmental level. No evidence of pulmonary embolism. Normal
heart size. No pericardial effusion.

Mediastinum/Nodes: No enlarged mediastinal, hilar, or axillary lymph
nodes. Thyroid gland, trachea, and esophagus demonstrate no
significant findings.

Lungs/Pleura: Minimal linear atelectasis or scarring at both lung
bases. Otherwise, clear lungs. No pleural fluid.

Musculoskeletal: Mild thoracic spine degenerative changes.

Review of the MIP images confirms the above findings.

CT ABDOMEN and PELVIS FINDINGS

Hepatobiliary: No focal liver abnormality is seen. No gallstones,
gallbladder wall thickening, or biliary dilatation.

Pancreas: Unremarkable. No pancreatic ductal dilatation or
surrounding inflammatory changes.

Spleen: Normal in size without focal abnormality.

Adrenals/Urinary Tract: Normal appearing right adrenal gland. Stable
1.0 cm oval, low density left adrenal mass. Small bilateral renal
cysts. Unremarkable ureters and urinary bladder.

Stomach/Bowel: Stool throughout the colon. Normal appearing stomach,
small bowel appendix.

Vascular/Lymphatic: No significant vascular findings are present. No
enlarged abdominal or pelvic lymph nodes.

Reproductive: Mildly enlarged prostate gland.

Other: Tiny umbilical hernia containing fat. Small bilateral
inguinal hernias containing fat.

Musculoskeletal: Mild lower thoracic spine degenerative changes.
Mild L5-S1 facet degenerative changes.

Review of the MIP images confirms the above findings.
IMPRESSION: 1. No pulmonary emboli or acute abnormality in the chest, abdomen or
pelvis.
2. Stool throughout the colon.
3. Stable 1.0 cm left adrenal adenoma.

## 2021-04-01 ENCOUNTER — Other Ambulatory Visit: Payer: Self-pay

## 2021-04-01 ENCOUNTER — Ambulatory Visit (INDEPENDENT_AMBULATORY_CARE_PROVIDER_SITE_OTHER): Payer: Managed Care, Other (non HMO) | Admitting: Cardiology

## 2021-04-01 ENCOUNTER — Encounter: Payer: Self-pay | Admitting: Cardiology

## 2021-04-01 ENCOUNTER — Ambulatory Visit (INDEPENDENT_AMBULATORY_CARE_PROVIDER_SITE_OTHER): Payer: Managed Care, Other (non HMO)

## 2021-04-01 VITALS — Ht 69.5 in | Wt 212.2 lb

## 2021-04-01 DIAGNOSIS — R001 Bradycardia, unspecified: Secondary | ICD-10-CM

## 2021-04-01 DIAGNOSIS — Z7901 Long term (current) use of anticoagulants: Secondary | ICD-10-CM

## 2021-04-01 NOTE — Patient Instructions (Signed)
Medication Instructions:  Your physician recommends that you continue on your current medications as directed. Please refer to the Current Medication list given to you today.  *If you need a refill on your cardiac medications before your next appointment, please call your pharmacy*   Lab Work: None If you have labs (blood work) drawn today and your tests are completely normal, you will receive your results only by: MyChart Message (if you have MyChart) OR A paper copy in the mail If you have any lab test that is abnormal or we need to change your treatment, we will call you to review the results.   Testing/Procedures: A zio monitor was ordered today. It will remain on for 7 days. You will then return monitor and event diary in provided box. It takes 1-2 weeks for report to be downloaded and returned to us. We will call you with the results. If monitor falls off or has orange flashing light, please call Zio for further instructions.     Follow-Up: At [facility], you and your health needs are our priority.  As part of our continuing mission to provide you with exceptional heart care, we have created designated Provider Care Teams.  These Care Teams include your primary Cardiologist (physician) and Advanced Practice Providers (APPs -  Physician Assistants and Nurse Practitioners) who all work together to provide you with the care you need, when you need it.  We recommend signing up for the patient portal called "MyChart".  Sign up information is provided on this After Visit Summary.  MyChart is used to connect with patients for Virtual Visits (Telemedicine).  Patients are able to view lab/test results, encounter notes, upcoming appointments, etc.  Non-urgent messages can be sent to your provider as well.   To learn more about what you can do with MyChart, go to [email or web address].    Your next appointment:   6 month(s)  The format for your next appointment:   In Person  Provider:    Brian Munley, MD    Other Instructions   

## 2021-04-01 NOTE — Progress Notes (Signed)
Cardiology Office Note:    Date:  04/01/2021   ID:  Patrick Gibbs, DOB August 12, 1966, MRN [id number]  PCP:  Patrick Brazil, DO  Cardiologist:  Patrick Herrlich, MD    Referring MD: Patrick Pigeon B, DO    ASSESSMENT:    1. Bradycardia   2. Chronic anticoagulation    PLAN:    In order of problems listed above:  1. He is having episodes of lightheadedness 1 of near syncope with bradycardia on a pulse meter at home we will apply a 7-day ZIO monitor to see if he has significant underlying bradycardia  2. Continue warfarin long-term unprovoked pulmonary embolism admitted with stroke 5 days prior 1 to 2 days after lumbar puncture   Next appointment: 6 months   Medication Adjustments/Labs and Tests Ordered: Current medicines are reviewed at length with the patient today.  Concerns regarding medicines are outlined above.  No orders of the defined types were placed in this encounter.  No orders of the defined types were placed in this encounter.   No chief complaint on file.   History of Present Illness:     Patrick Gibbs is a 55 y.o. male with a hx of recurrent chest pain with very extensive evaluation including multiple coronary angiograms, history of pulmonary embolism on chronic anticoagulation and COVID-19 pneumonia July 2021.  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 Compliance with diet, lifestyle and medications: Yes  Is having lightheadedness without heart rate low 40s and was lost 35 bpm no syncope.  He is taking no heart rate suppressing medications. 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 His predominant problem is cognitive and he is pending lumbar puncture and will need to withdraw his anticoagulant before. No angina edema shortness of breath. Past  Medical History:  Diagnosis Date  . Anterior neck pain   . Anticoagulated   . Anxiety   . Atypical chest pain 03/08/2020  . Bilateral pulmonary embolism (HCC) 11/19/2017   Monitored by hematology and prescribed xarelto  . Chest pain in adult 08/15/2017  . Chronic anticoagulation 04/13/2018  . Chronic fatigue   . Chronic fatigue   . Chronic fatigue   . Class 1 obesity due to excess calories with serious comorbidity and body mass index (BMI) of 30.0 to 30.9 in adult   . COPD (chronic obstructive pulmonary disease) (HCC)   . COVID-19   . Erectile dysfunction   . Fatigue 03/08/2020  . History of pulmonary embolus (PE)   . Hyperlipidemia   . Hyperopia with presbyopia of both eyes   . Keratoconjunctivitis of both eyes   . Mild concentric left ventricular hypertrophy (LVH)   . Mixed hyperlipidemia 07/21/2017  . Neuritis 04/24/2019  . Nocturnal hypoxia 07/10/2020   Formatting of this note might be different from the original. ONO 07/05/20 desat <88% 16 min 8 sec Formatting of this note might be different from the original. Formatting of this note might be different from the original. ONO 07/05/20 desat <88% 16 min 8 sec  . Nocturnal leg cramps   . Pain of upper abdomen 04/25/2019  . Palmar wart   . Palpitations   . Paresthesia and pain of both upper extremities 04/24/2019  . Paresthesia of both lower extremities 04/24/2019  . Precordial chest pain   .  PVC's (premature ventricular contractions)   . Rosacea   . Shortness of breath 03/08/2020  . Stage 1 mild COPD by GOLD classification (HCC) 04/29/2018   PFT 04/07/18 ratio 63% FEV1 70% FVC 89%  . Vitreous floater, bilateral     Past Surgical History:  Procedure Laterality Date  . CARDIAC CATHETERIZATION  12/2018  . COLONOSCOPY    . ESOPHAGOGASTRODUODENOSCOPY    . TONSILLECTOMY      Current Medications: Current Meds  Medication Sig  . acetaminophen (TYLENOL) 500 MG tablet Take by mouth every 6 (six) hours as needed for mild pain.  Marland Kitchen albuterol  (VENTOLIN HFA) 108 (90 Base) MCG/ACT inhaler Inhale into the lungs every 6 (six) hours as needed for wheezing or shortness of breath.  Marland Kitchen amLODipine (NORVASC) 2.5 MG tablet Take 2.5 mg by mouth daily.  . cetirizine (ZYRTEC) 10 MG tablet Take 1 tablet by mouth daily as needed for allergies.  . clonazePAM (KLONOPIN) 0.5 MG tablet Take 0.5 tablets by mouth at bedtime.  . cyanocobalamin (,VITAMIN Gibbs-12,) 1000 MCG/ML injection Inject into the muscle every 30 (thirty) days.  . Ergocalciferol 50 MCG (2000 UT) TABS Take 2,000 Units by mouth daily.  . nitroGLYCERIN (NITROSTAT) 0.4 MG SL tablet Place 0.4 mg under the tongue every 5 (five) minutes x 3 doses as needed for chest pain.  Marland Kitchen warfarin (COUMADIN) 5 MG tablet TAKE 2 TABLETS BY MOUTH DAILY AT 6 PM, EXCEPT 2 AND 1/2 TABLETS ON MONDAY, WEDNESDAY, FRIDAY AS DIRECTED     Allergies:   Ciprofloxacin, Escitalopram, Gramineae pollens, and Cymbalta [duloxetine hcl]   Social History   Socioeconomic History  . Marital status: Single    Spouse name: Not on file  . Number of children: Not on file  . Years of education: Not on file  . Highest education level: Not on file  Occupational History  . Not on file  Tobacco Use  . Smoking status: Former Smoker    Packs/day: 1.00    Years: 0.50    Pack years: 0.50    Types: Cigarettes    Quit date: 2008    Years since quitting: 14.4  . Smokeless tobacco: Never Used  Vaping Use  . Vaping Use: Never used  Substance and Sexual Activity  . Alcohol use: Yes    Comment: rarely  . Drug use: Never  . Sexual activity: Not on file  Other Topics Concern  . Not on file  Social History Narrative  . Not on file   Social Determinants of Health   Financial Resource Strain: Not on file  Food Insecurity: Not on file  Transportation Needs: Not on file  Physical Activity: Not on file  Stress: Not on file  Social Connections: Not on file     Family History: The patient's family history includes CAD in his  mother; Cataracts in his father, maternal grandmother, mother, paternal grandfather, and paternal grandmother; Diabetes in his father; Heart attack in his maternal uncle and maternal uncle. ROS:   Please see the history of present illness.    All other systems reviewed and are negative.  EKGs/Labs/Other Studies Reviewed:    The following studies were reviewed today:   Recent Labs: 07/15/2020: ALT 17; BUN 10; Creatinine, Ser 0.82; Hemoglobin 13.5; Platelets 195; Potassium 4.0; Sodium 136  Recent Lipid Panel    Component Value Date/Time   CHOL 147 03/01/2020 1120   TRIG 61 03/01/2020 1120   HDL 32 (L) 03/01/2020 1120   CHOLHDL 4.6 03/01/2020 1120  LDLCALC 102 (H) 03/01/2020 1120    Physical Exam:    VS:  Ht 5' 9.5" (1.765 m)   Wt 212 lb 3.2 oz (96.3 kg)   SpO2 98%   BMI 30.89 kg/m     Wt Readings from Last 3 Encounters:  04/01/21 212 lb 3.2 oz (96.3 kg)  09/09/20 215 lb (97.5 kg)  08/08/20 209 lb (94.8 kg)     GEN:  Well nourished, well developed in no acute distress HEENT: Normal NECK: No JVD; No carotid bruits LYMPHATICS: No lymphadenopathy CARDIAC: RRR, no murmurs, rubs, gallops RESPIRATORY:  Clear to auscultation without rales, wheezing or rhonchi  ABDOMEN: Soft, non-tender, non-distended MUSCULOSKELETAL:  No edema; No deformity  SKIN: Warm and dry NEUROLOGIC:  Alert and oriented x 3 PSYCHIATRIC:  Normal affect    Signed, Patrick Herrlich, MD  04/01/2021 3:43 PM    [facility]

## 2021-04-18 ENCOUNTER — Telehealth: Payer: Self-pay

## 2021-04-18 MED ORDER — ACEBUTOLOL HCL 200 MG PO CAPS: 200.0000 mg | ORAL_CAPSULE | Freq: Every day | ORAL | 3 refills | Status: DC

## 2021-04-18 NOTE — Telephone Encounter (Signed)
Spoke with patient regarding results and recommendation.  Patient verbalizes understanding and is agreeable to plan of care. Advised patient to call back with any issues or concerns.  

## 2021-04-18 NOTE — Telephone Encounter (Signed)
-----   Message from Baldo Daub, MD sent at 04/18/2021 12:18 PM EDT ----- His monitor does not show problems of slow heart rate however he has frequent PVCs which are symptomatic.  I would like him to try a low-dose of beta-blocker Sectral 200 mg daily record heart rate and blood pressure once or twice daily and send a MyChart message with results in about 2 weeks.

## 2021-05-14 ENCOUNTER — Telehealth: Payer: Self-pay | Admitting: Cardiology

## 2021-05-14 NOTE — Telephone Encounter (Signed)
Spoke to the patient just now and he let me know that he started having symptoms like trouble breathing, cognitive issues, fatigue, insomnia. He states that he is following up with his primary care provider and they are doing a work up on the cognitive aspect. I advised that he should stop taking this medication, especially if he believes this to be causing his symptoms. He states that he will stop it at this time.   I will make Dr. Dulce Sellar aware of this.

## 2021-05-14 NOTE — Telephone Encounter (Signed)
Pt c/o medication issue:  1. Name of Medication: acebutolol (SECTRAL) 200 MG capsule  2. How are you currently taking this medication (dosage and times per day)? 1 tablet daily  3. Are you having a reaction (difficulty breathing--STAT)? no  4. What is your medication issue? Patient states he has been having trouble breathing at night, insomnia, fatigue, cognitive issues. He states he also has pain on both sides of his head. He states he feels like he can't focus on what he is doing. He states it may be effecting his memory as well.

## 2021-08-19 ENCOUNTER — Telehealth: Payer: Self-pay | Admitting: Cardiology

## 2021-08-19 NOTE — Telephone Encounter (Signed)
Pt c/o of Chest Pain: STAT if CP now or developed within 24 hours  1. Are you having CP right now? no  2. Are you experiencing any other symptoms (ex. SOB, nausea, vomiting, sweating)? Dizziness and left arm pain,   3. How long have you been experiencing CP?  Last 2 weeks  4. Is your CP continuous or coming and going? Coming and going  5. Have you taken Nitroglycerin? Yes  Pain is high on left side of chest ?

## 2021-08-19 NOTE — Telephone Encounter (Signed)
Spoke to the patient just now and he let me know that he has been having chest pain in his left arm/left side of the chest for the past 4 days. It comes and goes and has happened about 3 times a day. He states that it only lasts for about 5 minutes or less but it happens at all times of the day. It feels like a burning pain. During these episodes he feels dizzy and off balance.   He tells me that at this time he feels good.   His blood pressure yesterday was 145/91 and his heart rate was 72 bpm. These are the most recent vitals that he has.   He takes his nitroglycerin tablets during these episodes but he does not think it helps since it goes away on its own anyway.   I advised that if this happened again he needed to go to be evaluated at [facility] ED. He verbalizes understanding of this.   I will route to Dr. Dulce Sellar to advise.

## 2021-09-14 NOTE — Progress Notes (Signed)
Cardiology Office Note:    Date:  09/15/2021   ID:  Christen Bame, DOB 12-May-1966, MRN [id number]  PCP:  Leola Brazil, DO  Cardiologist:  Norman Herrlich, MD    Referring MD: Leola Brazil, DO    ASSESSMENT:    1. PVC's (premature ventricular contractions)   2. Chronic anticoagulation   3. Agatston coronary artery calcium score less than 100    PLAN:    In order of problems listed above:  Difficult case to central features frequent PVCs intolerance of beta-blocker and I think it is best served by seeing EP make a decision regarding antiarrhythmic drug therapy?  1C agent versus EP catheter ablation of VT focus.  I will not restart a beta-blocker he perceives that he is intolerant and had a life-threatening side effect Continue long-term anticoagulation for nonoperable pulmonary embolism    Next appointment: I will see him back in the office as needed following EP evaluation and treatment   Medication Adjustments/Labs and Tests Ordered: Current medicines are reviewed at length with the patient today.  Concerns regarding medicines are outlined above.  No orders of the defined types were placed in this encounter.  No orders of the defined types were placed in this encounter.   Chief Complaint  Patient presents with   Follow-up    History of Present Illness:    Jarrett Albor is a 55 y.o. male with a hx of  recurrent chest pain with very extensive evaluation including multiple coronary angiograms, history of pulmonary embolism on chronic anticoagulation and COVID-19 pneumonia July 2021.  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  7-day event monitor reported service 04/18/2021 showed 52 triggered 3 diary events all sinus rhythm rates 59 to 104 bpm.  The events are usually  associated with PVCs and at times trigeminy.  He had frequent PVCs with a burden of 10.5% 64 couplets and no episodes of ventricular tachycardia.  Compliance with diet, lifestyle and medications: Yes  Is a difficult interaction He is on a calcium channel blocker for elevated blood pressure initiated by his PCP Home blood pressures are generally running less than 140 systolic Uses the word chest pain but when you speak to me is having palpitation. I tried a beta-blocker but he stopped it within a week he said that it made him short of breath We discussed putting him on extended release metoprolol but he told me he had severe bradycardia causing hospitalization taking metoprolol in the past I think he is best served by seeing EP and decision regarding the merits of antiarrhythmic drug therapy or even PVC ablation. Always complicated by cognitive dysfunction undergoing evaluation following COVID 19 He has a very extensive cardiology evaluation showing minimal coronary atherosclerosis coronary artery calcium score of 1 normal coronary angiography on several occasions and no evidence of cardial myopathy.  Most recent labs 07/07/2021 [facility]: Hemoglobin 14.2 platelets 290,000 TSH 1.19  Echo 08/09/2021:  1. Left ventricular ejection fraction, by estimation, is 55 to 60%. The  left ventricle has normal function. The left ventricle has no regional  wall motion abnormalities. There is mild concentric left ventricular  hypertrophy. Left ventricular diastolic  parameters are indeterminate. The average left ventricular global  longitudinal strain is -23.9 %. The global longitudinal strain is normal.   2.  Right ventricular systolic function is normal. The right ventricular  size is normal.   3. Left atrial size was mildly dilated.   4. The mitral valve is normal in structure. No evidence of mitral valve  regurgitation. No evidence of mitral stenosis.   5. The aortic valve is normal in  structure. Aortic valve regurgitation is  not visualized. No aortic stenosis is present.  Past Medical History:  Diagnosis Date   Anterior neck pain    Anticoagulated    Anxiety    Atypical chest pain 03/08/2020   Bilateral pulmonary embolism (HCC) 11/19/2017   Monitored by hematology and prescribed xarelto   Chest pain in adult 08/15/2017   Chronic anticoagulation 04/13/2018   Chronic fatigue    Chronic fatigue    Chronic fatigue    Class 1 obesity due to excess calories with serious comorbidity and body mass index (BMI) of 30.0 to 30.9 in adult    COPD (chronic obstructive pulmonary disease) (HCC)    COVID-19    Erectile dysfunction    Fatigue 03/08/2020   History of pulmonary embolus (PE)    Hyperlipidemia    Hyperopia with presbyopia of both eyes    Keratoconjunctivitis of both eyes    Mild concentric left ventricular hypertrophy (LVH)    Mixed hyperlipidemia 07/21/2017   Neuritis 04/24/2019   Nocturnal hypoxia 07/10/2020   Formatting of this note might be different from the original. ONO 07/05/20 desat <88% 16 min 8 sec Formatting of this note might be different from the original. Formatting of this note might be different from the original. ONO 07/05/20 desat <88% 16 min 8 sec   Nocturnal leg cramps    Pain of upper abdomen 04/25/2019   Palmar wart    Palpitations    Paresthesia and pain of both upper extremities 04/24/2019   Paresthesia of both lower extremities 04/24/2019   Precordial chest pain    PVC's (premature ventricular contractions)    Rosacea    Shortness of breath 03/08/2020   Stage 1 mild COPD by GOLD classification (HCC) 04/29/2018   PFT 04/07/18 ratio 63% FEV1 70% FVC 89%   Vitreous floater, bilateral     Past Surgical History:  Procedure Laterality Date   CARDIAC CATHETERIZATION  12/2018   COLONOSCOPY     ESOPHAGOGASTRODUODENOSCOPY     TONSILLECTOMY      Current Medications: Current Meds  Medication Sig   acetaminophen (TYLENOL) 500 MG tablet Take by mouth  every 6 (six) hours as needed for mild pain.   albuterol (VENTOLIN HFA) 108 (90 Base) MCG/ACT inhaler Inhale into the lungs every 6 (six) hours as needed for wheezing or shortness of breath.   amLODipine (NORVASC) 2.5 MG tablet Take 2.5 mg by mouth daily.   cetirizine (ZYRTEC) 10 MG tablet Take 1 tablet by mouth daily as needed for allergies.   clonazePAM (KLONOPIN) 0.5 MG tablet Take 0.5 tablets by mouth at bedtime.   Ergocalciferol 50 MCG (2000 UT) TABS Take 2,000 Units by mouth daily.   nitroGLYCERIN (NITROSTAT) 0.4 MG SL tablet Place 0.4 mg under the tongue every 5 (five) minutes x 3 doses as needed for chest pain.   vitamin B-12 (CYANOCOBALAMIN) 1000 MCG tablet Take 1,000 mcg by mouth daily.   warfarin (COUMADIN) 5 MG tablet TAKE 2 TABLETS BY MOUTH DAILY AT 6 PM, EXCEPT 2 AND 1/2 TABLETS ON MONDAY, WEDNESDAY, FRIDAY AS DIRECTED     Allergies:   Ciprofloxacin, Escitalopram, Gramineae pollens, and Cymbalta [duloxetine hcl]  Social History   Socioeconomic History   Marital status: Single    Spouse name: Not on file   Number of children: Not on file   Years of education: Not on file   Highest education level: Not on file  Occupational History   Not on file  Tobacco Use   Smoking status: Former    Packs/day: 1.00    Years: 0.50    Pack years: 0.50    Types: Cigarettes    Quit date: 2008    Years since quitting: 14.8   Smokeless tobacco: Never  Vaping Use   Vaping Use: Never used  Substance and Sexual Activity   Alcohol use: Yes    Comment: rarely   Drug use: Never   Sexual activity: Not on file  Other Topics Concern   Not on file  Social History Narrative   Not on file   Social Determinants of Health   Financial Resource Strain: Not on file  Food Insecurity: Not on file  Transportation Needs: Not on file  Physical Activity: Not on file  Stress: Not on file  Social Connections: Not on file     Family History: The patient's family history includes CAD in his  mother; Cataracts in his father, maternal grandmother, mother, paternal grandfather, and paternal grandmother; Diabetes in his father; Heart attack in his maternal uncle and maternal uncle. ROS:   Please see the history of present illness.    All other systems reviewed and are negative.  EKGs/Labs/Other Studies Reviewed:    The following studies were reviewed today:  EKG:  EKG ordered today and personally reviewed.  The ekg ordered today demonstrates sinus rhythm normal no PVCs  Recent Labs: No results found for requested labs within last 8760 hours.  Recent Lipid Panel    Component Value Date/Time   CHOL 147 03/01/2020 1120   TRIG 61 03/01/2020 1120   HDL 32 (L) 03/01/2020 1120   CHOLHDL 4.6 03/01/2020 1120   LDLCALC 102 (H) 03/01/2020 1120    Physical Exam:    VS:  BP 136/88 (BP Location: Right Arm, Patient Position: Sitting, Cuff Size: Normal)   Ht 5' 9.5" (1.765 m)   Wt 219 lb (99.3 kg)   BMI 31.88 kg/m     Wt Readings from Last 3 Encounters:  09/15/21 219 lb (99.3 kg)  04/01/21 212 lb 3.2 oz (96.3 kg)  09/09/20 215 lb (97.5 kg)     GEN:  Well nourished, well developed in no acute distress HEENT: Normal NECK: No JVD; No carotid bruits LYMPHATICS: No lymphadenopathy CARDIAC: RRR, no murmurs, rubs, gallops RESPIRATORY:  Clear to auscultation without rales, wheezing or rhonchi  ABDOMEN: Soft, non-tender, non-distended MUSCULOSKELETAL:  No edema; No deformity  SKIN: Warm and dry NEUROLOGIC:  Alert and oriented x 3 PSYCHIATRIC:  Normal affect    Signed, Norman Herrlich, MD  09/15/2021 7:42 AM    [facility]

## 2021-09-15 ENCOUNTER — Encounter: Payer: Self-pay | Admitting: Cardiology

## 2021-09-15 ENCOUNTER — Ambulatory Visit (INDEPENDENT_AMBULATORY_CARE_PROVIDER_SITE_OTHER): Payer: Managed Care, Other (non HMO) | Admitting: Cardiology

## 2021-09-15 ENCOUNTER — Other Ambulatory Visit: Payer: Self-pay

## 2021-09-15 VITALS — BP 136/88 | HR 70 | Ht 69.5 in | Wt 219.0 lb

## 2021-09-15 DIAGNOSIS — R931 Abnormal findings on diagnostic imaging of heart and coronary circulation: Secondary | ICD-10-CM | POA: Diagnosis not present

## 2021-09-15 DIAGNOSIS — Z7901 Long term (current) use of anticoagulants: Secondary | ICD-10-CM | POA: Diagnosis not present

## 2021-09-15 DIAGNOSIS — I493 Ventricular premature depolarization: Secondary | ICD-10-CM

## 2021-09-15 NOTE — Patient Instructions (Signed)

## 2021-10-06 ENCOUNTER — Institutional Professional Consult (permissible substitution): Payer: Managed Care, Other (non HMO) | Admitting: Cardiology

## 2021-10-30 ENCOUNTER — Institutional Professional Consult (permissible substitution): Payer: Managed Care, Other (non HMO) | Admitting: Cardiology

## 2021-11-18 ENCOUNTER — Ambulatory Visit: Payer: Managed Care, Other (non HMO) | Admitting: Cardiology

## 2021-12-08 ENCOUNTER — Institutional Professional Consult (permissible substitution): Payer: Managed Care, Other (non HMO) | Admitting: Cardiology

## 2021-12-16 ENCOUNTER — Encounter: Payer: Self-pay | Admitting: Cardiology
